# Patient Record
Sex: Female | Born: 1955
Health system: Southern US, Community
[De-identification: ages and names within clinical notes are randomized; demographics above are authoritative.]

## PROBLEM LIST (undated history)

## (undated) DIAGNOSIS — H409 Unspecified glaucoma: Secondary | ICD-10-CM

## (undated) DIAGNOSIS — M069 Rheumatoid arthritis, unspecified: Secondary | ICD-10-CM

## (undated) DIAGNOSIS — M3500 Sicca syndrome, unspecified: Secondary | ICD-10-CM

## (undated) DIAGNOSIS — M549 Dorsalgia, unspecified: Secondary | ICD-10-CM

## (undated) DIAGNOSIS — J040 Acute laryngitis: Secondary | ICD-10-CM

## (undated) DIAGNOSIS — E7800 Pure hypercholesterolemia, unspecified: Secondary | ICD-10-CM

## (undated) DIAGNOSIS — E785 Hyperlipidemia, unspecified: Secondary | ICD-10-CM

## (undated) DIAGNOSIS — M797 Fibromyalgia: Secondary | ICD-10-CM

## (undated) DIAGNOSIS — L409 Psoriasis, unspecified: Secondary | ICD-10-CM

## (undated) DIAGNOSIS — E039 Hypothyroidism, unspecified: Secondary | ICD-10-CM

## (undated) DIAGNOSIS — M8580 Other specified disorders of bone density and structure, unspecified site: Secondary | ICD-10-CM

## (undated) DIAGNOSIS — F1120 Opioid dependence, uncomplicated: Secondary | ICD-10-CM

## (undated) DIAGNOSIS — I447 Left bundle-branch block, unspecified: Secondary | ICD-10-CM

## (undated) DIAGNOSIS — C649 Malignant neoplasm of unspecified kidney, except renal pelvis: Secondary | ICD-10-CM

## (undated) DIAGNOSIS — I771 Stricture of artery: Secondary | ICD-10-CM

## (undated) DIAGNOSIS — K219 Gastro-esophageal reflux disease without esophagitis: Secondary | ICD-10-CM

## (undated) DIAGNOSIS — G8929 Other chronic pain: Secondary | ICD-10-CM

## (undated) DIAGNOSIS — M1990 Unspecified osteoarthritis, unspecified site: Secondary | ICD-10-CM

## (undated) DIAGNOSIS — I10 Essential (primary) hypertension: Secondary | ICD-10-CM

## (undated) DIAGNOSIS — E876 Hypokalemia: Secondary | ICD-10-CM

## (undated) DIAGNOSIS — J449 Chronic obstructive pulmonary disease, unspecified: Secondary | ICD-10-CM

## (undated) DIAGNOSIS — L4050 Arthropathic psoriasis, unspecified: Secondary | ICD-10-CM

## (undated) HISTORY — PX: LAPAROSCOPIC OVARIAN CYSTECTOMY: SUR786

## (undated) HISTORY — DX: Left bundle-branch block, unspecified: I44.7

## (undated) HISTORY — DX: Hyperlipidemia, unspecified: E78.5

## (undated) HISTORY — DX: Sjogren syndrome, unspecified: M35.00

## (undated) HISTORY — DX: Chronic obstructive pulmonary disease, unspecified: J44.9

## (undated) HISTORY — DX: Arthropathic psoriasis, unspecified: L40.50

## (undated) HISTORY — PX: ABDOMINAL HYSTERECTOMY: SUR658

## (undated) HISTORY — DX: Unspecified glaucoma: H40.9

## (undated) HISTORY — DX: Unspecified osteoarthritis, unspecified site: M19.90

## (undated) HISTORY — DX: Rheumatoid arthritis, unspecified: M06.9

## (undated) HISTORY — DX: Hypokalemia: E87.6

## (undated) HISTORY — DX: Malignant neoplasm of unspecified kidney, except renal pelvis: C64.9

## (undated) HISTORY — PX: APPENDECTOMY: SHX54

## (undated) HISTORY — DX: Stricture of artery: I77.1

## (undated) HISTORY — PX: TONSILLECTOMY: SUR1361

---

## 1998-06-05 ENCOUNTER — Other Ambulatory Visit: Admission: RE | Admit: 1998-06-05 | Discharge: 1998-06-05 | Payer: Self-pay | Admitting: Internal Medicine

## 1999-05-28 ENCOUNTER — Encounter: Admission: RE | Admit: 1999-05-28 | Discharge: 1999-05-28 | Payer: Self-pay | Admitting: Internal Medicine

## 1999-05-28 ENCOUNTER — Encounter: Payer: Self-pay | Admitting: Internal Medicine

## 2000-04-19 ENCOUNTER — Encounter: Payer: Self-pay | Admitting: Internal Medicine

## 2000-04-19 ENCOUNTER — Encounter: Admission: RE | Admit: 2000-04-19 | Discharge: 2000-04-19 | Payer: Self-pay | Admitting: Internal Medicine

## 2000-04-27 ENCOUNTER — Encounter: Admission: RE | Admit: 2000-04-27 | Discharge: 2000-06-08 | Payer: Self-pay | Admitting: Neurosurgery

## 2002-06-18 ENCOUNTER — Ambulatory Visit (HOSPITAL_COMMUNITY): Admission: RE | Admit: 2002-06-18 | Discharge: 2002-06-18 | Payer: Self-pay | Admitting: Cardiology

## 2002-06-18 HISTORY — PX: CARDIAC CATHETERIZATION: SHX172

## 2002-07-01 ENCOUNTER — Ambulatory Visit (HOSPITAL_COMMUNITY): Admission: RE | Admit: 2002-07-01 | Discharge: 2002-07-01 | Payer: Self-pay | Admitting: Internal Medicine

## 2003-02-28 ENCOUNTER — Ambulatory Visit (HOSPITAL_COMMUNITY): Admission: RE | Admit: 2003-02-28 | Discharge: 2003-02-28 | Payer: Self-pay | Admitting: Internal Medicine

## 2003-04-16 ENCOUNTER — Ambulatory Visit (HOSPITAL_COMMUNITY): Admission: RE | Admit: 2003-04-16 | Discharge: 2003-04-16 | Payer: Self-pay | Admitting: *Deleted

## 2005-05-31 ENCOUNTER — Encounter: Admission: RE | Admit: 2005-05-31 | Discharge: 2005-05-31 | Payer: Self-pay | Admitting: Orthopedic Surgery

## 2005-06-14 ENCOUNTER — Encounter: Admission: RE | Admit: 2005-06-14 | Discharge: 2005-06-14 | Payer: Self-pay | Admitting: Orthopedic Surgery

## 2005-11-10 ENCOUNTER — Encounter: Admission: RE | Admit: 2005-11-10 | Discharge: 2005-11-10 | Payer: Self-pay | Admitting: Internal Medicine

## 2006-11-07 ENCOUNTER — Encounter: Admission: RE | Admit: 2006-11-07 | Discharge: 2006-11-07 | Payer: Self-pay | Admitting: Internal Medicine

## 2007-06-27 ENCOUNTER — Encounter: Admission: RE | Admit: 2007-06-27 | Discharge: 2007-08-16 | Payer: Self-pay | Admitting: Psychiatry

## 2008-02-19 ENCOUNTER — Encounter: Admission: RE | Admit: 2008-02-19 | Discharge: 2008-02-19 | Payer: Self-pay | Admitting: Endocrinology

## 2008-08-13 ENCOUNTER — Inpatient Hospital Stay (HOSPITAL_COMMUNITY): Admission: EM | Admit: 2008-08-13 | Discharge: 2008-08-14 | Payer: Self-pay | Admitting: Emergency Medicine

## 2009-02-18 ENCOUNTER — Encounter: Admission: RE | Admit: 2009-02-18 | Discharge: 2009-02-18 | Payer: Self-pay | Admitting: Endocrinology

## 2009-07-17 HISTORY — PX: NM MYOCAR PERF WALL MOTION: HXRAD629

## 2010-03-19 ENCOUNTER — Encounter
Admission: RE | Admit: 2010-03-19 | Discharge: 2010-03-19 | Payer: Self-pay | Source: Home / Self Care | Attending: Internal Medicine | Admitting: Internal Medicine

## 2010-07-20 LAB — CBC
HCT: 40.4 % (ref 36.0–46.0)
HCT: 43.4 % (ref 36.0–46.0)
Hemoglobin: 14 g/dL (ref 12.0–15.0)
MCV: 93.2 fL (ref 78.0–100.0)
MCV: 93.7 fL (ref 78.0–100.0)
Platelets: 212 10*3/uL (ref 150–400)
RBC: 4.66 MIL/uL (ref 3.87–5.11)
RDW: 12.3 % (ref 11.5–15.5)
RDW: 12.4 % (ref 11.5–15.5)
WBC: 10.1 10*3/uL (ref 4.0–10.5)
WBC: 8.5 10*3/uL (ref 4.0–10.5)

## 2010-07-20 LAB — DIFFERENTIAL
Lymphs Abs: 3.9 10*3/uL (ref 0.7–4.0)
Neutrophils Relative %: 52 % (ref 43–77)

## 2010-07-20 LAB — BASIC METABOLIC PANEL: GFR calc Af Amer: >60 mL/min (ref >60)

## 2010-07-20 LAB — CARDIAC PANEL(CRET KIN+CKTOT+MB+TROPI)
CK, MB: 2.5 ng/mL (ref 0.3–4.0)
Relative Index: 1.8 (ref 0.0–2.5)
Total CK: 142 U/L (ref 7–177)

## 2010-07-20 LAB — HEMOGLOBIN A1C: Hgb A1c MFr Bld: 5.9 % (ref 4.6–6.1)

## 2010-07-20 LAB — POCT I-STAT, CHEM 8
BUN: 10 mg/dL (ref 6–23)
Calcium, Ion: 1.11 mmol/L — ABNORMAL LOW (ref 1.12–1.32)
Creatinine, Ser: 0.9 mg/dL (ref 0.4–1.2)
HCT: 46 % (ref 36.0–46.0)
Hemoglobin: 15.6 g/dL — ABNORMAL HIGH (ref 12.0–15.0)
Potassium: 3.8 mEq/L (ref 3.5–5.1)
Sodium: 137 mEq/L (ref 135–145)

## 2010-07-20 LAB — D-DIMER, QUANTITATIVE: D-Dimer, Quant: 0.27 ug/mL-FEU (ref 0.00–0.48)

## 2010-07-20 LAB — PROTIME-INR: Prothrombin Time: 14.6 seconds (ref 11.6–15.2)

## 2010-07-20 LAB — CK TOTAL AND CKMB (NOT AT ARMC)
CK, MB: 2.6 ng/mL (ref 0.3–4.0)
Relative Index: 1.7 (ref 0.0–2.5)
Total CK: 156 U/L (ref 7–177)

## 2010-07-20 LAB — POCT CARDIAC MARKERS: CKMB, poc: 1.4 ng/mL (ref 1.0–8.0)

## 2010-07-20 LAB — LIPID PANEL
LDL Cholesterol: 86 mg/dL (ref 0–99)
Triglycerides: 125 mg/dL (ref <150)
VLDL: 25 mg/dL (ref 0–40)

## 2010-07-20 LAB — CREATININE, SERUM
Creatinine, Ser: 0.9 mg/dL (ref 0.4–1.2)
GFR calc non Af Amer: >60 mL/min (ref >60)

## 2010-07-20 LAB — APTT: aPTT: 39 seconds — ABNORMAL HIGH (ref 24–37)

## 2010-08-05 HISTORY — PX: US ECHOCARDIOGRAPHY: HXRAD669

## 2010-08-24 NOTE — H&P (Signed)
[name], NOLTON NO.:  [id number]   MEDICAL RECORD NO.:  [id number]          PATIENT TYPE:  INP   LOCATION:  2033                         FACILITY:  [facility]   PHYSICIAN:  Sheliah Mends, MD      DATE OF BIRTH:  February 03, 1956   DATE OF ADMISSION:  08/13/2008  DATE OF DISCHARGE:                              HISTORY & PHYSICAL   PRIMARY CARE PHYSICIAN:  Soyla Murphy. Renne Crigler, M.D.   PRIMARY CARDIOLOGIST:  Antionette Char, M.D.   CHIEF COMPLAINT:  Chest pain.   HISTORY OF THE PRESENT ILLNESS:  The patient is a 55 year old white  female with history of patent coronary arteries in 2004 after a positive  stress test and has done well since that time who presented to Dr.  Hulan Fess office today with chest pain.  The pain began Saturday.  She had mowed her lawn with a self-propelled machine.  She takes her  time, stops and has beverages, and keep walking.  She felt fine  initially and then  became nauseated, had severe no energy that just  occurred suddenly.  She actually fell to her knees and had to lie down  on the kitchen floor; she thought she was going to pass out prior to  that.  At that point she felt like she had a vice around her chest with  pain radiating from her chest to her back.  She was very weak.  She  would have called 9-1-1 at that point, but she was not near the phone;  and, then she started feeling better.  She rested, she ate and she felt  better, but every time she would do much it would return, but not nearly  as severe.  She actually did not feel well until Monday and she started  feeling as mostly it was back and neck pain described as from the base  of her skull down into her thoracic spine.  No shortness of breath.  Because she continued not to feel well she went to Dr. Carolee Rota office.  There he called EMS because she had a new left bundle branch block on  her EKG and EMS gave her aspirin as well as nitroglycerin, she improved  completely and  she had no complaints afterwards.  Currently, she does  complain of some aching in her chest and more severe neck pain.  The  patient is a little more complicated because she does have arthritis  that is fairly severe and she is not sure if the neck pain is more  related to the arthritis versus the pain she had over the weekend.   PAST MEDICAL AND SURGICAL HISTORY:  1. Hypertension.  2. Psoriatic arthritis as well as psoriasis.  3. Hypothyroidism.  4. Glaucoma.  5. Fibromyalgia.  6. Dyslipidemia.  7. Peripheral vascular disease with carotid stenosis, mild per Dr.      Carolee Rota note.  8. Status post hysterectomy.  9. The patient has a history of two C-sections in the past;  10.Appendectomy; and,  11.Tonsillectomy.   ALLERGIES:  LEVAQUIN AND CODEINE.  WHEN SHE  TAKES BABY ASPIRIN ON A  DAILY BASIS IT CAUSES NAUSEA AND GI UPSET.  FIORICET CAUSES NAUSEA.  DARVOCET CAUSES NAUSEA.  HIGH-DOSE ZOLOFT CAUSES HOMICIDAL THOUGHTS.   FAMILY HISTORY:  Mother died at age 33, but she began having MIs in her  57s and she was diabetic.  Father died at 64 with a stroke and he also  was a  diabetic, had TB and, she believes, coronary disease.  She has of  8 brothers; two have coronary disease as well as cancer.  One sister  died of the seizures and one sister is alive and well.   SOCIAL HISTORY:  The patient is divorced with two children and nine  grandchildren.  She smokes one half to three-quarters of a pack of  cigarettes per day or every 2 days; sometimes she may even go months  without smoking.  She actually started smoking at  age 32 and more  consecutively at age; but, smoked steadily, three-quarters of a pack for  26 years.  She is active and meals on wheels her lawn.  She is does not  work due to disability due to the arthritis.  No alcohol use.   REVIEW OF SYSTEMS:  GENERAL:  Not felt well now for a couple weeks; kind  of headachy, but no fever though.  INTEGUMENT:  Skin is without  rashes,  except for her psoriasis.  GASTROINTESTINAL:  Nausea and occasional  loose stool, but no blood in her stools.  GENITOURINARY:  No hematuria  or dysuria.  MUSCULOSKELETAL: She did have some swelling in her right  thigh briefly, but  this resolved and there is no sign of that now, and  no pain.  She does have constant pain from the arthritis in her legs,  arms and neck.  NEUROLOGICAL:  No syncope, except for near syncope with  Saturday's episode.  PULMONARY:  Positive tobacco use.  ENDOCRINE:  Positive thyroid disease.  CARDIOVASCULAR:  Occasional fluttering; and,  see the HPI.   PHYSICAL EXAMINATION:  VITAL SIGNS:  Blood pressure 118/72, pulse 65,  respirations 20, temperature 97, and oxygen saturation on 4 liters is  100%.  GENERAL APPEARANCE:  In general the patient is alert and oriented x3.  SKIN:  The skin is warm and dry.  Brisk capillary refill.  PSYCHIATRIC:  Pleasant affect.  HEENT:  Normocephalic head.  Sclerae clear.  NECK:  The neck is supple.  No JVD and no bruits are detected.  HEART:  Cardiac - S1 and S2, regular rate and rhythm without obvious  murmur, gallop, rub or click.  LUNGS:  The lungs are clear without rales, rhonchi or wheezes.  ABDOMEN:  The abdomen is soft and nontender with positive bowel sounds.  EXTREMITIES:  The extremities are without edema currently.  NEUROLOGIC EXAMINATION:  Neuro - alert and oriented x3.  Moves all  extremities.  Follows commands.   LABORATORY DATA:  CBC;  hemoglobin 15.1, hematocrit 43.4, platelet  312,000, and WBCs 10.  Sodium 137, potassium 3.8, BUN 10, creatinine  0.9, and  glucose 94.  CK/MB was 156 with 2.6 MB.  Troponin I 0.02.  Two-  view of the chest; no acute findings, but mild thickening of the minor  fissure.  EKG;  sinus rhythm with a left bundle branch block.   IMPRESSION:  1. Chest pain, weakness and nausea.  2. New left bundle branch block.  3. Positive tobacco use.  4. Dyslipidemia.  Of note, her family  history is positive  for coronary      disease, premature, with mother in her 25s.  5. Psoriatic arthritis, on multiple pain medications.   PLAN:  1. Admit to telemetry bed.  2. Serial CK/MB and troponin I.  3. Lovenox.  4. Nothing per os after midnight for possible cath versus stress and      likely scan Myoview secondary to a new left bundle branch block.  5. Continue pain meds.  6. Dr. Garen Lah is aware of the patient's admission.  7. We will check lipids in the morning as well.  8. Also, because of the right thigh swelling, we will check a D-dimer      and she will be on Lovenox.  9. Also tobacco cessation.      Darcella Gasman. Annie Paras, N.P.      Sheliah Mends, MD  Electronically Signed    LRI/MEDQ  D:  08/13/2008  T:  08/14/2008  Job:  [id number]   cc:   Soyla Murphy. Renne Crigler, M.D.  Antionette Char, MD

## 2010-08-27 NOTE — Op Note (Signed)
NAME:  [name], [name]                          ACCOUNT NO.:  [id number]   MEDICAL RECORD NO.:  [id number]                   PATIENT TYPE:  AMB   LOCATION:  ENDO                                 [facility]AC[facility]  [facility]   PHYSICIAN:  Georgiana Spinner, M.D.                 DATE OF BIRTH:  [birth year]   DATE OF PROCEDURE:  DATE OF DISCHARGE:                                 OPERATIVE REPORT   PROCEDURE:  Colonoscopy.   INDICATIONS:  Colon cancer screening and hemoccult positivity.   ANESTHESIA:  Demerol 30 mg, Versed 3.   DESCRIPTION OF PROCEDURE:  With the patient mildly sedated and in the left  lateral decubitus position, the Olympus videoscopic colonoscope was inserted  in the rectum and passed under direct vision to the cecum, identified by  ileocecal valve and appendiceal orifice, both of which were photographed.  From this point,  the colonoscope was slowly withdrawn, taking  circumferential views of the entire colonic mucosa stopping only in the  rectum which appeared normal on direct and showed hemorrhoids on retroflexed  view.  The endoscope was straightened and withdrawn.  The patient's vital  signs and pulse oximetry remained stable.  The patient tolerated the  procedure well without apparent complications.   FINDINGS:  Internal hemorrhoids; otherwise unremarkable exam.   PLAN:  Have the patient follow up with me as needed.                                               Georgiana Spinner, M.D.    GMO/MEDQ  D:  04/16/2003  T:  04/16/2003  Job:  [id number]

## 2010-08-27 NOTE — Discharge Summary (Signed)
[name], [name] NO.:  [id number]   MEDICAL RECORD NO.:  [id number]          PATIENT TYPE:  INP   LOCATION:  2033                         FACILITY:  [facility]   PHYSICIAN:  Sheliah Mends, MD      DATE OF BIRTH:  Jan 13, 1956   DATE OF ADMISSION:  08/13/2008  DATE OF DISCHARGE:  08/14/2008                               DISCHARGE SUMMARY   The patient signed out AMA on Aug 14, 2008.   DISCHARGE DIAGNOSES:  1. Chest pain, weakness, and nausea.  2. Left bundle-branch block, which was new.  3. Positive tobacco use.  4. Family history of coronary disease with mother age 85.  5. Psoriatic arthritis, on multiple pain medications.  6. The patient was agitated and has written on her AMA form, could not      get meds like I was used to taking care of my pain, and she could      not wait to talk to physician in the morning.  She left the      hospital, so no new medications were added.   HISTORY OF PRESENT ILLNESS:  Pleasant 55 year old female with history of  pain course in 2004 with cardiac cath, presented to Dr. Renne Crigler on Aug 13, 2008 with chest pain.  The pain began Saturday.  She had mowed the lawn  with a [employer], felt fine and became nauseated, no energy  suddenly, near-syncope, and actually fell to her knees in her kitchen.  She felt as though ice was around her chest, very weak.  She then began  to feel better, ate, felt bad until Monday, then back and neck pain.  No  shortness of breath, but she did not feel well, so she saw Dr. Renne Crigler.  She was given nitroglycerin with improvement.  EMS was called.  She was  brought to [facility] and we saw her here.  She had been given orders to  continue home medicines, which she unfortunately was not brought to her  on the floor.  We instructed the floor to please get her home meds ASAP  and then she would more likely stay if she was able to take her meds as  previously prescribed.   She did stay the night, but by  the next morning again was not receiving  medications as quickly, she felt she needed them and signed out AMA.   PAST MEDICAL HISTORY:  Hypertension, psoriatic arthritis, hypothyroid.  No diabetes, fibromyalgia, psoriasis, dyslipidemia, peripheral vascular  disease.   ALLERGIES:  LEVAQUIN, CODEINE, and even BABY ASPIRIN causes nausea and  GI upset.  FIORICET cause nausea.  DARVOCET cause nausea.  Increased  dose of ZOLOFT caused homicidal thoughts.   FAMILY HISTORY:  Mother died at 60 after Mis.  Father died at 26 with  coronary disease.  Brother has diabetes.  Two brothers with heart  disease, 1 sister who is alive and well.   SOCIAL HISTORY:  Divorced, 2  children, 9 grandchildren.  Positive  tobacco for 1/2 to 3/4 pack per day x40 years.  She was active,  was  mowing the grass, and no alcohol use.   PHYSICAL EXAMINATION:  VITAL SIGNS:  On admission, her blood pressure  was 118/72, pulse 65, respirations 20, temperature 97, oxygen saturation  on 4 L was 100%.   LABORATORY VALUES:  Hemoglobin 15.1, hematocrit 43.4, WBC 10.1,  platelets 212.  Neutrophil 52, lymphs 39, monos 7, eos 2, baso 1.  Protime 14.6, INR of 1.1, PTT 39, and D-dimer was 0.27.  Sodium 137,  potassium 3.8, chloride 101, glucose 94, BUN 10, creatinine 0.90.  Remained stable.  Glycohemoglobin was 5.9.  Cardiac enzymes were  negative with CK 156, 142; MB 2.6, 2.5; and troponin 0.02.   Total cholesterol 139, triglycerides 125, HDL 28, and LDL 86.  TSH  0.838.  EKGs, sinus rhythm with left bundle-branch block.  Chest x-ray,  two views; no acute findings, mild thickness of minor fissure.      Darcella Gasman. Annie Paras, N.P.      Sheliah Mends, MD  Electronically Signed    LRI/MEDQ  D:  09/04/2008  T:  09/05/2008  Job:  [id number]   cc:   Antionette Char, MD  Sheliah Mends, MD  Soyla Murphy. Renne Crigler, M.D.

## 2010-08-27 NOTE — Op Note (Signed)
NAME:  [name], [name]                          ACCOUNT NO.:  [id number]   MEDICAL RECORD NO.:  [id number]                   PATIENT TYPE:  AMB   LOCATION:  ENDO                                 FACILITY:  [facility]   PHYSICIAN:  Georgiana Spinner, M.D.                 DATE OF BIRTH:  06/07/1955   DATE OF PROCEDURE:  DATE OF DISCHARGE:                                 OPERATIVE REPORT   PROCEDURE:  Upper endoscopy.   INDICATIONS FOR PROCEDURE:  Abdominal pain.   ANESTHESIA:  Demerol 100, Versed 10 mg.   DESCRIPTION OF PROCEDURE:  With the patient mildly sedated in the left  lateral decubitus position, the Olympus videoscopic endoscope was inserted  in the mouth and passed under direct vision through the esophagus which  appeared normal into the stomach. The fundus, body, antrum, duodenal bulb  and second portion of the duodenum appeared normal. From this point, the  endoscope was slowly withdrawn taking circumferential views of the entire  duodenal mucosa until the endoscope was then pulled back in the stomach,  placed in retroflexion to view the stomach from below. The endoscope was  then straightened and withdrawn taking circumferential views of the  remaining gastric and esophageal mucosa. The patient's vital signs and pulse  oximeter remained stable. The patient tolerated the procedure well without  apparent complications.   FINDINGS:  Unremarkable examination.   PLAN:  Proceed to colonoscopy.                                               Georgiana Spinner, M.D.    GMO/MEDQ  D:  04/16/2003  T:  04/16/2003  Job:  [id number]

## 2010-08-27 NOTE — Cardiovascular Report (Signed)
NAME:  [name], [name]                          ACCOUNT NO.:  [id number]   MEDICAL RECORD NO.:  [id number]                   PATIENT TYPE:  OIB   LOCATION:  2853                                 FACILITY:  [facility]   PHYSICIAN:  Aram Candela. Tysinger, M.D.              DATE OF BIRTH:  1955-06-29   DATE OF PROCEDURE:  06/18/2002  DATE OF DISCHARGE:                              CARDIAC CATHETERIZATION   REFERRING PHYSICIAN:  Soyla Murphy. Renne Crigler, M.D.   PROCEDURES:  1. Left heart catheterization.  2. Coronary cineangiography.  3. Left ventricular cineangiography.  4. Abdominal aortogram.  5. Perclose of the right femoral artery.   INDICATION FOR PROCEDURES:  This 55 year old female with a history of  psoriatic rheumatoid arthritis, hypertension and a strong family history of  coronary artery disease with heart attacks at an early age had the onset of  chest pain approximately a year ago with acceleration recently.  She had a  stress test which was early positive showing moderate high-grade for  ischemic heart disease.  She also has an abnormal resting electrocardiogram  with evidence for possible old anteroseptal infarct.   DESCRIPTION OF PROCEDURE:  After signing an informed consent, the patient  was premedicated with 50 mg of Benadryl intravenously and brought to the  cardiac catheterization lab.  Her right groin was prepped and draped in a  sterile fashion and anesthetized locally with 1% lidocaine.  A 6 French  introducer sheath was inserted percutaneously into the right femoral artery.  A 6 French #4 Judkins coronary catheters were used to make injections into  the native coronary arteries.  A 6 French pigtail catheter was used to  measure pressures in the left ventricle and aorta and to make mid stream  injections into the left ventricle and abdominal aorta.  The patient  tolerated the procedure well and no complications were noted.  At the end of  the procedure, the catheter and sheath  were removed from the right femoral  artery and hemostasis was easily obtained with a Perclose closure system.   MEDICATIONS GIVEN:  None.   HEMODYNAMIC DATA:  Left ventricular pressure 160/15-20, aortic pressure  160/82 with a mean of 114.  Left ventricular ejection fraction 60.   CINE FINDINGS:   CORONARY CINE ANGIOGRAPHY:  1. Left coronary artery:  The ostium and left main appear normal.  2. Left anterior descending appears normal.  3. Circumflex coronary artery appears normal.  4. Right coronary artery appears normal.   LEFT VENTRICULAR CINEANGIOGRAM:  The left ventricular chamber size and  contractility appears normal with an ejection fraction estimated at 60%.  There were no abnormally moving segments.  The mitral and aortic valves  appear normal.   ABDOMINAL AORTOGRAM:  The abdominal aorta appears normal without evidence  for atherosclerotic plaque and normal antegrade flow.  The renal arteries  are normal.   FINAL DIAGNOSES:  1. Normal cardiac  study.  2. Normal coronary arteries.  3. Normal left ventricular function.  4. Normal mitral and aortic valves.  5. Normal abdominal aorta and renal arteries.  6. Successful Perclose of the right femoral artery.   DISPOSITION:  Will monitor on the short-stay unit prior to discharge later  today and will arrange for followup in the office with myself and with Dr.  Renne Crigler.                                                    John R. Aleen Campi, M.D.    JRT/MEDQ  D:  06/18/2002  T:  06/18/2002  Job:  [id number]   cc:   Soyla Murphy. Renne Crigler, M.D.  [address]  [place]  [place] [address]  Fax: [phone]   Cardiac Catheterization Lab

## 2011-02-28 ENCOUNTER — Emergency Department (HOSPITAL_COMMUNITY): Payer: Medicare Other

## 2011-02-28 ENCOUNTER — Emergency Department (HOSPITAL_COMMUNITY)
Admission: EM | Admit: 2011-02-28 | Discharge: 2011-02-28 | Disposition: A | Discharge status: Home / Self Care | Payer: Medicare Other | Attending: Emergency Medicine | Admitting: Emergency Medicine

## 2011-02-28 ENCOUNTER — Encounter: Payer: Self-pay | Admitting: Emergency Medicine

## 2011-02-28 DIAGNOSIS — E039 Hypothyroidism, unspecified: Secondary | ICD-10-CM | POA: Insufficient documentation

## 2011-02-28 DIAGNOSIS — M549 Dorsalgia, unspecified: Secondary | ICD-10-CM | POA: Insufficient documentation

## 2011-02-28 DIAGNOSIS — I10 Essential (primary) hypertension: Secondary | ICD-10-CM | POA: Insufficient documentation

## 2011-02-28 DIAGNOSIS — G43909 Migraine, unspecified, not intractable, without status migrainosus: Secondary | ICD-10-CM | POA: Insufficient documentation

## 2011-02-28 DIAGNOSIS — R29898 Other symptoms and signs involving the musculoskeletal system: Secondary | ICD-10-CM | POA: Insufficient documentation

## 2011-02-28 DIAGNOSIS — Z79899 Other long term (current) drug therapy: Secondary | ICD-10-CM | POA: Insufficient documentation

## 2011-02-28 DIAGNOSIS — R209 Unspecified disturbances of skin sensation: Secondary | ICD-10-CM | POA: Insufficient documentation

## 2011-02-28 DIAGNOSIS — R110 Nausea: Secondary | ICD-10-CM | POA: Insufficient documentation

## 2011-02-28 DIAGNOSIS — E7800 Pure hypercholesterolemia, unspecified: Secondary | ICD-10-CM | POA: Insufficient documentation

## 2011-02-28 DIAGNOSIS — K219 Gastro-esophageal reflux disease without esophagitis: Secondary | ICD-10-CM | POA: Insufficient documentation

## 2011-02-28 DIAGNOSIS — G8929 Other chronic pain: Secondary | ICD-10-CM | POA: Insufficient documentation

## 2011-02-28 HISTORY — DX: Psoriasis, unspecified: L40.9

## 2011-02-28 HISTORY — DX: Pure hypercholesterolemia, unspecified: E78.00

## 2011-02-28 HISTORY — DX: Opioid dependence, uncomplicated: F11.20

## 2011-02-28 HISTORY — DX: Gastro-esophageal reflux disease without esophagitis: K21.9

## 2011-02-28 HISTORY — DX: Other chronic pain: G89.29

## 2011-02-28 HISTORY — DX: Essential (primary) hypertension: I10

## 2011-02-28 HISTORY — DX: Fibromyalgia: M79.7

## 2011-02-28 HISTORY — DX: Dorsalgia, unspecified: M54.9

## 2011-02-28 HISTORY — DX: Acute laryngitis: J04.0

## 2011-02-28 HISTORY — DX: Hypothyroidism, unspecified: E03.9

## 2011-02-28 HISTORY — DX: Other specified disorders of bone density and structure, unspecified site: M85.80

## 2011-02-28 LAB — CBC
MCH: 31.9 pg (ref 26.0–34.0)
MCHC: 33.9 g/dL (ref 30.0–36.0)
Platelets: 269 10*3/uL (ref 150–400)
RBC: 4.48 MIL/uL (ref 3.87–5.11)
RDW: 12.4 % (ref 11.5–15.5)

## 2011-02-28 LAB — BASIC METABOLIC PANEL
CO2: 28 mEq/L (ref 19–32)
Calcium: 9.9 mg/dL (ref 8.4–10.5)
GFR calc Af Amer: >90 mL/min (ref >90)
GFR calc non Af Amer: >90 mL/min (ref >90)
Sodium: 138 mEq/L (ref 135–145)

## 2011-02-28 MED ORDER — DIPHENHYDRAMINE HCL 50 MG/ML IJ SOLN
25.0000 mg | Freq: Once | INTRAMUSCULAR | Status: AC
  Administered 2011-02-28: 25 mg via INTRAVENOUS
  Filled 2011-02-28: qty 1

## 2011-02-28 MED ORDER — METOCLOPRAMIDE HCL 5 MG/ML IJ SOLN
10.0000 mg | Freq: Once | INTRAMUSCULAR | Status: AC
  Administered 2011-02-28: 10 mg via INTRAVENOUS
  Filled 2011-02-28: qty 2

## 2011-02-28 MED ORDER — SODIUM CHLORIDE 0.9 % IV BOLUS (SEPSIS)
1000.0000 mL | Freq: Once | INTRAVENOUS | Status: AC
  Administered 2011-02-28: 1000 mL via INTRAVENOUS

## 2011-02-28 MED ORDER — DEXAMETHASONE SODIUM PHOSPHATE 10 MG/ML IJ SOLN
10.0000 mg | Freq: Once | INTRAMUSCULAR | Status: AC
  Administered 2011-02-28: 10 mg via INTRAVENOUS
  Filled 2011-02-28: qty 1

## 2011-02-28 NOTE — ED Provider Notes (Signed)
History     CSN: [id number] Arrival date & time: 02/28/2011  5:18 PM   First MD Initiated Contact with Patient 02/28/11 1838      Chief Complaint  Patient presents with  . Migraine    (Consider location/radiation/quality/duration/timing/severity/associated sxs/prior treatment) Patient is a 55 y.o. female presenting with headaches and neurologic complaint. The history is provided by the patient.  Headache  This is a new problem. The current episode started more than 2 days ago. The problem occurs constantly. The problem has not changed since onset.The headache is associated with an unknown factor. The pain is located in the bilateral region. The pain is at a severity of 10/10. The pain is severe. The pain does not radiate. Associated symptoms include nausea. Pertinent negatives include no fever, no shortness of breath and no vomiting. She has tried oral narcotic analgesics for the symptoms. The treatment provided no relief.  Neurologic Problem The primary symptoms include headaches, loss of sensation (right hand (though has resolved except right thumb) and nausea. Primary symptoms do not include fever or vomiting. The symptoms began 3 to 5 days ago. The symptoms are improving. The neurological symptoms are focal.  The headache began more than 2 days ago. The headache developed gradually. Headache is a new problem. The headache is present rarely. Location/region(s) of the headache: bilateral. The headache is not associated with neck stiffness or weakness.  Loss of sensation began 6 - 12 hours ago. The loss of sensation is improving. The deficit is described as loss of sensation to touch and pain. Affected locations include: right thumb.  Nausea began 3 to 5 days ago.  Additional symptoms do not include neck stiffness or weakness.    Past Medical History  Diagnosis Date  . Psoriasis   . Chronic back pain   . Hypertension   . Hypercholesteremia   . Osteopenia   . Hypothyroid   .  Fibromyalgia   . Addiction, opium   . GERD (gastroesophageal reflux disease)   . Laryngitis     Past Surgical History  Procedure Date  . Cesarean section   . Appendectomy   . Laparoscopic ovarian cystectomy   . Tonsillectomy     No family history on file.  History  Substance Use Topics  . Smoking status: Not on file  . Smokeless tobacco: Not on file  . Alcohol Use:     OB History    Grav Para Term Preterm Abortions TAB SAB Ect Mult Living                  Review of Systems  Constitutional: Negative for fever.  HENT: Negative for neck stiffness.   Respiratory: Negative for cough and shortness of breath.   Cardiovascular: Negative for chest pain.  Gastrointestinal: Positive for nausea. Negative for vomiting, abdominal pain and diarrhea.  Neurological: Positive for headaches. Negative for weakness.  All other systems reviewed and are negative.    Allergies  Arava; Aspirin; Codeine; Darvocet; Fioricet; Levaquin; Sulfa antibiotics; and Zoloft  Home Medications   Current Outpatient Rx  Name Route Sig Dispense Refill  . ATORVASTATIN CALCIUM 40 MG PO TABS Oral Take 40 mg by mouth daily.      . CELECOXIB 200 MG PO CAPS Oral Take 200 mg by mouth daily.      Marland Kitchen ETANERCEPT 50 MG/ML [place] SOLN Subcutaneous Inject 50 mg into the skin 2 (two) times a week. Tuesday and Friday.    Marland Kitchen GABAPENTIN 600 MG PO TABS Oral Take  600 mg by mouth 3 (three) times daily.      Marland Kitchen LEVOTHYROXINE SODIUM 112 MCG PO TABS Oral Take 112 mcg by mouth daily.      Marland Kitchen METHADONE HCL 10 MG PO TABS Oral Take 10 mg by mouth 3 (three) times daily.      Carma Leaven M PLUS PO TABS Oral Take 1 tablet by mouth daily.      . OMEGA-3-ACID ETHYL ESTERS 1 G PO CAPS Oral Take 3 g by mouth daily.      . OXYCODONE-ACETAMINOPHEN 10-325 MG PO TABS Oral Take 1 tablet by mouth 3 (three) times daily as needed. For pain     . TIZANIDINE HCL 4 MG PO TABS Oral Take 4-8 mg by mouth 4 (four) times daily.      Marland Kitchen  VALSARTAN-HYDROCHLOROTHIAZIDE 160-25 MG PO TABS Oral Take 1 tablet by mouth daily.        BP 127/77  Pulse 67  Temp(Src) 98 F (36.7 C) (Oral)  Resp 16  SpO2 100%  Physical Exam  Nursing note and vitals reviewed. Constitutional: She is oriented to person, place, and time. She appears well-developed and well-nourished. No distress.  HENT:  Head: Normocephalic and atraumatic.  Mouth/Throat: Oropharynx is clear and moist.  Eyes: EOM are normal. Pupils are equal, round, and reactive to light.  Neck: Normal range of motion.  Cardiovascular: Normal rate, regular rhythm and normal heart sounds.   Pulmonary/Chest: Effort normal and breath sounds normal. No respiratory distress.  Abdominal: Soft. She exhibits no distension. There is no tenderness.  Musculoskeletal: Normal range of motion.  Neurological: She is alert and oriented to person, place, and time. A sensory deficit is present. No cranial nerve deficit. She exhibits abnormal muscle tone (slight decrease in stregnth on right side as compared to left). Coordination normal. GCS eye subscore is 4. GCS verbal subscore is 5. GCS motor subscore is 6.       No sensation on top of thumb, but present on bottom of thumb to both light touch and sharp  Skin: Skin is warm and dry.    ED Course  Procedures (including critical care time)   Labs Reviewed  CBC  BASIC METABOLIC PANEL   Ct Head Wo Contrast  02/28/2011  *RADIOLOGY REPORT*  Clinical Data: Migraine headache, right hand numbness  CT HEAD WITHOUT CONTRAST  Technique:  Contiguous axial images were obtained from the base of the skull through the vertex without contrast.  Comparison: None  Findings: Mild atrophy. Normal ventricular morphology. No midline shift or mass effect. Suspect prominent perivascular space at inferior left basal ganglia. No intracranial hemorrhage, mass lesion or evidence of acute infarction identified. No extra-axial fluid collections. Visualized paranasal sinuses and  mastoid air cells clear. Bones unremarkable.  IMPRESSION: No acute intracranial abnormalities.  Original Report Authenticated By: Lollie Marrow, M.D.     1. Migraine       MDM  7:04 PM Pt seen and examined. Pt with three days of headache. This morning she noticed that her right hand is numb, though she still had movement. She saw her PCP who sent her to ED for further workup. Pt has tried methadone/oxycodone for her headache without relief. She states she is not allowed to take extra tylenol or any NSAIDs (on celebrex). Of note, patient states she has diffuse arthritis and does not have any additional neck pain or decreased movement than normal at her neck. On exam, patient with no clinical signs of meningitis.  9:13 PM Pt states her headache is much improved. Ct head pending.  Pt CT head unremarkable. Pt likely with migraine headache which has improved with migraine cocktail of benadryl/reglan/decadron. Will advise pt to see her doctor in several days if the headache returns. Numbness in hand is more likely related to patient's severe arthritis than to a CVA event as head CT unremarkable.  Daleen Bo 03/01/11 0038

## 2011-02-28 NOTE — ED Notes (Signed)
Per EMS:  PT here with c/o migraine headache since Saturday as well as numbness and tingling in the fingers.  Pt rates pain 10/10.

## 2011-02-28 NOTE — ED Notes (Signed)
Patient transported to CT 

## 2011-02-28 NOTE — ED Notes (Signed)
Pt here with c/o frontal lobe migraine that started on Saturday and ha s become progressively worse.  Pt rates pain 10/10 and reports nausea with the pain.  Pt denies any vision changes as welll as vomiting and photophobia.  Pt alert and oriented x 4.

## 2011-02-28 NOTE — ED Provider Notes (Signed)
I saw and evaluated the patient, reviewed the resident's note and I agree with the findings and plan.  Patient headache most likely secondary to her history of migraines patient feeling better at this time. Patient prefers to go home which is appropriate.  Shelda Jakes, MD 02/28/11 2251

## 2011-04-21 DIAGNOSIS — L4050 Arthropathic psoriasis, unspecified: Secondary | ICD-10-CM | POA: Diagnosis not present

## 2011-04-21 DIAGNOSIS — IMO0001 Reserved for inherently not codable concepts without codable children: Secondary | ICD-10-CM | POA: Diagnosis not present

## 2011-04-21 DIAGNOSIS — M533 Sacrococcygeal disorders, not elsewhere classified: Secondary | ICD-10-CM | POA: Diagnosis not present

## 2011-04-21 DIAGNOSIS — M4802 Spinal stenosis, cervical region: Secondary | ICD-10-CM | POA: Diagnosis not present

## 2011-05-18 DIAGNOSIS — Z79899 Other long term (current) drug therapy: Secondary | ICD-10-CM | POA: Diagnosis not present

## 2011-05-18 DIAGNOSIS — R5381 Other malaise: Secondary | ICD-10-CM | POA: Diagnosis not present

## 2011-05-18 DIAGNOSIS — E039 Hypothyroidism, unspecified: Secondary | ICD-10-CM | POA: Diagnosis not present

## 2011-05-19 DIAGNOSIS — M4802 Spinal stenosis, cervical region: Secondary | ICD-10-CM | POA: Diagnosis not present

## 2011-05-19 DIAGNOSIS — M533 Sacrococcygeal disorders, not elsewhere classified: Secondary | ICD-10-CM | POA: Diagnosis not present

## 2011-05-19 DIAGNOSIS — M545 Low back pain, unspecified: Secondary | ICD-10-CM | POA: Diagnosis not present

## 2011-05-19 DIAGNOSIS — Z5181 Encounter for therapeutic drug level monitoring: Secondary | ICD-10-CM | POA: Diagnosis not present

## 2011-05-19 DIAGNOSIS — IMO0001 Reserved for inherently not codable concepts without codable children: Secondary | ICD-10-CM | POA: Diagnosis not present

## 2011-05-19 DIAGNOSIS — L4050 Arthropathic psoriasis, unspecified: Secondary | ICD-10-CM | POA: Diagnosis not present

## 2011-05-20 DIAGNOSIS — L4050 Arthropathic psoriasis, unspecified: Secondary | ICD-10-CM | POA: Diagnosis not present

## 2011-05-20 DIAGNOSIS — M159 Polyosteoarthritis, unspecified: Secondary | ICD-10-CM | POA: Diagnosis not present

## 2011-06-21 DIAGNOSIS — E782 Mixed hyperlipidemia: Secondary | ICD-10-CM | POA: Diagnosis not present

## 2011-06-21 DIAGNOSIS — Z79899 Other long term (current) drug therapy: Secondary | ICD-10-CM | POA: Diagnosis not present

## 2011-06-23 DIAGNOSIS — M533 Sacrococcygeal disorders, not elsewhere classified: Secondary | ICD-10-CM | POA: Diagnosis not present

## 2011-06-23 DIAGNOSIS — IMO0001 Reserved for inherently not codable concepts without codable children: Secondary | ICD-10-CM | POA: Diagnosis not present

## 2011-06-23 DIAGNOSIS — I10 Essential (primary) hypertension: Secondary | ICD-10-CM | POA: Diagnosis not present

## 2011-06-23 DIAGNOSIS — L4050 Arthropathic psoriasis, unspecified: Secondary | ICD-10-CM | POA: Diagnosis not present

## 2011-06-23 DIAGNOSIS — Z79899 Other long term (current) drug therapy: Secondary | ICD-10-CM | POA: Diagnosis not present

## 2011-06-23 DIAGNOSIS — M899 Disorder of bone, unspecified: Secondary | ICD-10-CM | POA: Diagnosis not present

## 2011-06-23 DIAGNOSIS — M949 Disorder of cartilage, unspecified: Secondary | ICD-10-CM | POA: Diagnosis not present

## 2011-06-23 DIAGNOSIS — M4802 Spinal stenosis, cervical region: Secondary | ICD-10-CM | POA: Diagnosis not present

## 2011-06-23 DIAGNOSIS — E782 Mixed hyperlipidemia: Secondary | ICD-10-CM | POA: Diagnosis not present

## 2011-07-21 DIAGNOSIS — M545 Low back pain, unspecified: Secondary | ICD-10-CM | POA: Diagnosis not present

## 2011-07-21 DIAGNOSIS — L4050 Arthropathic psoriasis, unspecified: Secondary | ICD-10-CM | POA: Diagnosis not present

## 2011-07-21 DIAGNOSIS — M4802 Spinal stenosis, cervical region: Secondary | ICD-10-CM | POA: Diagnosis not present

## 2011-07-21 DIAGNOSIS — IMO0001 Reserved for inherently not codable concepts without codable children: Secondary | ICD-10-CM | POA: Diagnosis not present

## 2011-07-21 DIAGNOSIS — M533 Sacrococcygeal disorders, not elsewhere classified: Secondary | ICD-10-CM | POA: Diagnosis not present

## 2011-07-21 DIAGNOSIS — I10 Essential (primary) hypertension: Secondary | ICD-10-CM | POA: Diagnosis not present

## 2011-07-21 DIAGNOSIS — E7800 Pure hypercholesterolemia, unspecified: Secondary | ICD-10-CM | POA: Diagnosis not present

## 2011-08-18 DIAGNOSIS — M545 Low back pain, unspecified: Secondary | ICD-10-CM | POA: Diagnosis not present

## 2011-08-18 DIAGNOSIS — IMO0001 Reserved for inherently not codable concepts without codable children: Secondary | ICD-10-CM | POA: Diagnosis not present

## 2011-08-18 DIAGNOSIS — M533 Sacrococcygeal disorders, not elsewhere classified: Secondary | ICD-10-CM | POA: Diagnosis not present

## 2011-08-18 DIAGNOSIS — M4802 Spinal stenosis, cervical region: Secondary | ICD-10-CM | POA: Diagnosis not present

## 2011-09-15 DIAGNOSIS — M533 Sacrococcygeal disorders, not elsewhere classified: Secondary | ICD-10-CM | POA: Diagnosis not present

## 2011-09-15 DIAGNOSIS — IMO0001 Reserved for inherently not codable concepts without codable children: Secondary | ICD-10-CM | POA: Diagnosis not present

## 2011-09-15 DIAGNOSIS — M4802 Spinal stenosis, cervical region: Secondary | ICD-10-CM | POA: Diagnosis not present

## 2011-09-15 DIAGNOSIS — L4050 Arthropathic psoriasis, unspecified: Secondary | ICD-10-CM | POA: Diagnosis not present

## 2011-11-10 DIAGNOSIS — M4802 Spinal stenosis, cervical region: Secondary | ICD-10-CM | POA: Diagnosis not present

## 2011-11-10 DIAGNOSIS — M545 Low back pain, unspecified: Secondary | ICD-10-CM | POA: Diagnosis not present

## 2011-11-10 DIAGNOSIS — L4050 Arthropathic psoriasis, unspecified: Secondary | ICD-10-CM | POA: Diagnosis not present

## 2011-11-10 DIAGNOSIS — IMO0001 Reserved for inherently not codable concepts without codable children: Secondary | ICD-10-CM | POA: Diagnosis not present

## 2011-11-10 DIAGNOSIS — I10 Essential (primary) hypertension: Secondary | ICD-10-CM | POA: Diagnosis not present

## 2011-11-10 DIAGNOSIS — E782 Mixed hyperlipidemia: Secondary | ICD-10-CM | POA: Diagnosis not present

## 2011-11-10 DIAGNOSIS — M533 Sacrococcygeal disorders, not elsewhere classified: Secondary | ICD-10-CM | POA: Diagnosis not present

## 2011-11-14 DIAGNOSIS — B370 Candidal stomatitis: Secondary | ICD-10-CM | POA: Diagnosis not present

## 2011-11-14 DIAGNOSIS — M159 Polyosteoarthritis, unspecified: Secondary | ICD-10-CM | POA: Diagnosis not present

## 2011-11-14 DIAGNOSIS — L4050 Arthropathic psoriasis, unspecified: Secondary | ICD-10-CM | POA: Diagnosis not present

## 2011-11-17 DIAGNOSIS — Z043 Encounter for examination and observation following other accident: Secondary | ICD-10-CM | POA: Diagnosis not present

## 2011-11-17 DIAGNOSIS — S9030XA Contusion of unspecified foot, initial encounter: Secondary | ICD-10-CM | POA: Diagnosis not present

## 2011-11-17 DIAGNOSIS — M79609 Pain in unspecified limb: Secondary | ICD-10-CM | POA: Diagnosis not present

## 2011-11-17 DIAGNOSIS — S9000XA Contusion of unspecified ankle, initial encounter: Secondary | ICD-10-CM | POA: Diagnosis not present

## 2012-01-02 DIAGNOSIS — M26609 Unspecified temporomandibular joint disorder, unspecified side: Secondary | ICD-10-CM | POA: Diagnosis not present

## 2012-01-02 DIAGNOSIS — M810 Age-related osteoporosis without current pathological fracture: Secondary | ICD-10-CM | POA: Diagnosis not present

## 2012-01-02 DIAGNOSIS — J45909 Unspecified asthma, uncomplicated: Secondary | ICD-10-CM | POA: Diagnosis not present

## 2012-01-03 DIAGNOSIS — J45909 Unspecified asthma, uncomplicated: Secondary | ICD-10-CM | POA: Diagnosis not present

## 2012-01-03 DIAGNOSIS — Z1231 Encounter for screening mammogram for malignant neoplasm of breast: Secondary | ICD-10-CM | POA: Diagnosis not present

## 2012-01-05 DIAGNOSIS — M545 Low back pain, unspecified: Secondary | ICD-10-CM | POA: Diagnosis not present

## 2012-01-05 DIAGNOSIS — M4802 Spinal stenosis, cervical region: Secondary | ICD-10-CM | POA: Diagnosis not present

## 2012-01-05 DIAGNOSIS — IMO0001 Reserved for inherently not codable concepts without codable children: Secondary | ICD-10-CM | POA: Diagnosis not present

## 2012-01-05 DIAGNOSIS — L4050 Arthropathic psoriasis, unspecified: Secondary | ICD-10-CM | POA: Diagnosis not present

## 2012-01-06 DIAGNOSIS — D72829 Elevated white blood cell count, unspecified: Secondary | ICD-10-CM | POA: Diagnosis not present

## 2012-01-06 DIAGNOSIS — R059 Cough, unspecified: Secondary | ICD-10-CM | POA: Diagnosis not present

## 2012-01-06 DIAGNOSIS — J45909 Unspecified asthma, uncomplicated: Secondary | ICD-10-CM | POA: Diagnosis not present

## 2012-01-06 DIAGNOSIS — R05 Cough: Secondary | ICD-10-CM | POA: Diagnosis not present

## 2012-01-10 DIAGNOSIS — I6529 Occlusion and stenosis of unspecified carotid artery: Secondary | ICD-10-CM | POA: Diagnosis not present

## 2012-01-12 ENCOUNTER — Ambulatory Visit
Admission: RE | Admit: 2012-01-12 | Discharge: 2012-01-12 | Disposition: A | Discharge status: Home / Self Care | Payer: Medicare Other | Source: Ambulatory Visit | Attending: Internal Medicine | Admitting: Internal Medicine

## 2012-01-12 ENCOUNTER — Other Ambulatory Visit: Payer: Self-pay | Admitting: Internal Medicine

## 2012-01-12 DIAGNOSIS — R490 Dysphonia: Secondary | ICD-10-CM | POA: Diagnosis not present

## 2012-01-12 DIAGNOSIS — K219 Gastro-esophageal reflux disease without esophagitis: Secondary | ICD-10-CM | POA: Diagnosis not present

## 2012-01-12 DIAGNOSIS — E049 Nontoxic goiter, unspecified: Secondary | ICD-10-CM

## 2012-01-12 DIAGNOSIS — D72829 Elevated white blood cell count, unspecified: Secondary | ICD-10-CM | POA: Diagnosis not present

## 2012-01-12 DIAGNOSIS — J45909 Unspecified asthma, uncomplicated: Secondary | ICD-10-CM | POA: Diagnosis not present

## 2012-01-12 DIAGNOSIS — E041 Nontoxic single thyroid nodule: Secondary | ICD-10-CM

## 2012-01-30 DIAGNOSIS — I10 Essential (primary) hypertension: Secondary | ICD-10-CM | POA: Diagnosis not present

## 2012-01-30 DIAGNOSIS — R109 Unspecified abdominal pain: Secondary | ICD-10-CM | POA: Diagnosis not present

## 2012-01-30 DIAGNOSIS — F17200 Nicotine dependence, unspecified, uncomplicated: Secondary | ICD-10-CM | POA: Diagnosis not present

## 2012-01-30 DIAGNOSIS — I729 Aneurysm of unspecified site: Secondary | ICD-10-CM | POA: Diagnosis not present

## 2012-01-30 DIAGNOSIS — M549 Dorsalgia, unspecified: Secondary | ICD-10-CM | POA: Diagnosis not present

## 2012-02-29 DIAGNOSIS — E7800 Pure hypercholesterolemia, unspecified: Secondary | ICD-10-CM | POA: Diagnosis not present

## 2012-02-29 DIAGNOSIS — IMO0001 Reserved for inherently not codable concepts without codable children: Secondary | ICD-10-CM | POA: Diagnosis not present

## 2012-02-29 DIAGNOSIS — Z5181 Encounter for therapeutic drug level monitoring: Secondary | ICD-10-CM | POA: Diagnosis not present

## 2012-02-29 DIAGNOSIS — I700 Atherosclerosis of aorta: Secondary | ICD-10-CM | POA: Diagnosis not present

## 2012-02-29 DIAGNOSIS — M545 Low back pain, unspecified: Secondary | ICD-10-CM | POA: Diagnosis not present

## 2012-02-29 DIAGNOSIS — L4050 Arthropathic psoriasis, unspecified: Secondary | ICD-10-CM | POA: Diagnosis not present

## 2012-02-29 DIAGNOSIS — R238 Other skin changes: Secondary | ICD-10-CM | POA: Diagnosis not present

## 2012-02-29 DIAGNOSIS — M129 Arthropathy, unspecified: Secondary | ICD-10-CM | POA: Diagnosis not present

## 2012-02-29 DIAGNOSIS — M4802 Spinal stenosis, cervical region: Secondary | ICD-10-CM | POA: Diagnosis not present

## 2012-03-05 DIAGNOSIS — H40019 Open angle with borderline findings, low risk, unspecified eye: Secondary | ICD-10-CM | POA: Diagnosis not present

## 2012-03-29 DIAGNOSIS — R490 Dysphonia: Secondary | ICD-10-CM | POA: Diagnosis not present

## 2012-03-29 DIAGNOSIS — J029 Acute pharyngitis, unspecified: Secondary | ICD-10-CM | POA: Diagnosis not present

## 2012-03-29 DIAGNOSIS — K219 Gastro-esophageal reflux disease without esophagitis: Secondary | ICD-10-CM | POA: Diagnosis not present

## 2012-04-20 DIAGNOSIS — L4050 Arthropathic psoriasis, unspecified: Secondary | ICD-10-CM | POA: Diagnosis not present

## 2012-04-20 DIAGNOSIS — M545 Low back pain, unspecified: Secondary | ICD-10-CM | POA: Diagnosis not present

## 2012-04-20 DIAGNOSIS — M899 Disorder of bone, unspecified: Secondary | ICD-10-CM | POA: Diagnosis not present

## 2012-04-20 DIAGNOSIS — M159 Polyosteoarthritis, unspecified: Secondary | ICD-10-CM | POA: Diagnosis not present

## 2012-04-20 DIAGNOSIS — Z23 Encounter for immunization: Secondary | ICD-10-CM | POA: Diagnosis not present

## 2012-04-20 DIAGNOSIS — I10 Essential (primary) hypertension: Secondary | ICD-10-CM | POA: Diagnosis not present

## 2012-04-20 DIAGNOSIS — E7800 Pure hypercholesterolemia, unspecified: Secondary | ICD-10-CM | POA: Diagnosis not present

## 2012-04-20 DIAGNOSIS — M949 Disorder of cartilage, unspecified: Secondary | ICD-10-CM | POA: Diagnosis not present

## 2012-04-20 DIAGNOSIS — E039 Hypothyroidism, unspecified: Secondary | ICD-10-CM | POA: Diagnosis not present

## 2012-04-25 DIAGNOSIS — L4050 Arthropathic psoriasis, unspecified: Secondary | ICD-10-CM | POA: Diagnosis not present

## 2012-04-25 DIAGNOSIS — M4802 Spinal stenosis, cervical region: Secondary | ICD-10-CM | POA: Diagnosis not present

## 2012-04-25 DIAGNOSIS — M533 Sacrococcygeal disorders, not elsewhere classified: Secondary | ICD-10-CM | POA: Diagnosis not present

## 2012-04-25 DIAGNOSIS — IMO0001 Reserved for inherently not codable concepts without codable children: Secondary | ICD-10-CM | POA: Diagnosis not present

## 2012-05-16 DIAGNOSIS — L4050 Arthropathic psoriasis, unspecified: Secondary | ICD-10-CM | POA: Diagnosis not present

## 2012-05-16 DIAGNOSIS — M159 Polyosteoarthritis, unspecified: Secondary | ICD-10-CM | POA: Diagnosis not present

## 2012-05-16 DIAGNOSIS — M2550 Pain in unspecified joint: Secondary | ICD-10-CM | POA: Diagnosis not present

## 2012-05-16 DIAGNOSIS — B370 Candidal stomatitis: Secondary | ICD-10-CM | POA: Diagnosis not present

## 2012-06-04 DIAGNOSIS — M79609 Pain in unspecified limb: Secondary | ICD-10-CM | POA: Diagnosis not present

## 2012-06-04 DIAGNOSIS — R609 Edema, unspecified: Secondary | ICD-10-CM | POA: Diagnosis not present

## 2012-06-04 DIAGNOSIS — R42 Dizziness and giddiness: Secondary | ICD-10-CM | POA: Diagnosis not present

## 2012-06-04 DIAGNOSIS — L0390 Cellulitis, unspecified: Secondary | ICD-10-CM | POA: Diagnosis not present

## 2012-06-04 DIAGNOSIS — L0291 Cutaneous abscess, unspecified: Secondary | ICD-10-CM | POA: Diagnosis not present

## 2012-06-20 DIAGNOSIS — L4050 Arthropathic psoriasis, unspecified: Secondary | ICD-10-CM | POA: Diagnosis not present

## 2012-06-20 DIAGNOSIS — M2550 Pain in unspecified joint: Secondary | ICD-10-CM | POA: Diagnosis not present

## 2012-06-20 DIAGNOSIS — IMO0001 Reserved for inherently not codable concepts without codable children: Secondary | ICD-10-CM | POA: Diagnosis not present

## 2012-06-20 DIAGNOSIS — M4802 Spinal stenosis, cervical region: Secondary | ICD-10-CM | POA: Diagnosis not present

## 2012-06-20 DIAGNOSIS — M129 Arthropathy, unspecified: Secondary | ICD-10-CM | POA: Diagnosis not present

## 2012-06-21 DIAGNOSIS — L4050 Arthropathic psoriasis, unspecified: Secondary | ICD-10-CM | POA: Diagnosis not present

## 2012-06-21 DIAGNOSIS — M2550 Pain in unspecified joint: Secondary | ICD-10-CM | POA: Diagnosis not present

## 2012-06-21 DIAGNOSIS — M159 Polyosteoarthritis, unspecified: Secondary | ICD-10-CM | POA: Diagnosis not present

## 2012-07-20 DIAGNOSIS — L4050 Arthropathic psoriasis, unspecified: Secondary | ICD-10-CM | POA: Diagnosis not present

## 2012-07-20 DIAGNOSIS — M159 Polyosteoarthritis, unspecified: Secondary | ICD-10-CM | POA: Diagnosis not present

## 2012-08-15 DIAGNOSIS — M533 Sacrococcygeal disorders, not elsewhere classified: Secondary | ICD-10-CM | POA: Diagnosis not present

## 2012-08-15 DIAGNOSIS — L4050 Arthropathic psoriasis, unspecified: Secondary | ICD-10-CM | POA: Diagnosis not present

## 2012-08-15 DIAGNOSIS — IMO0001 Reserved for inherently not codable concepts without codable children: Secondary | ICD-10-CM | POA: Diagnosis not present

## 2012-08-15 DIAGNOSIS — N390 Urinary tract infection, site not specified: Secondary | ICD-10-CM | POA: Diagnosis not present

## 2012-08-15 DIAGNOSIS — M129 Arthropathy, unspecified: Secondary | ICD-10-CM | POA: Diagnosis not present

## 2012-09-17 DIAGNOSIS — M159 Polyosteoarthritis, unspecified: Secondary | ICD-10-CM | POA: Diagnosis not present

## 2012-09-17 DIAGNOSIS — L4050 Arthropathic psoriasis, unspecified: Secondary | ICD-10-CM | POA: Diagnosis not present

## 2012-10-16 DIAGNOSIS — I10 Essential (primary) hypertension: Secondary | ICD-10-CM | POA: Diagnosis not present

## 2012-10-16 DIAGNOSIS — Z0000 Encounter for general adult medical examination without abnormal findings: Secondary | ICD-10-CM | POA: Diagnosis not present

## 2012-10-16 DIAGNOSIS — E7800 Pure hypercholesterolemia, unspecified: Secondary | ICD-10-CM | POA: Diagnosis not present

## 2012-10-16 DIAGNOSIS — E039 Hypothyroidism, unspecified: Secondary | ICD-10-CM | POA: Diagnosis not present

## 2012-10-19 DIAGNOSIS — E7800 Pure hypercholesterolemia, unspecified: Secondary | ICD-10-CM | POA: Diagnosis not present

## 2012-10-19 DIAGNOSIS — I10 Essential (primary) hypertension: Secondary | ICD-10-CM | POA: Diagnosis not present

## 2012-10-19 DIAGNOSIS — Z79899 Other long term (current) drug therapy: Secondary | ICD-10-CM | POA: Diagnosis not present

## 2012-10-19 DIAGNOSIS — I700 Atherosclerosis of aorta: Secondary | ICD-10-CM | POA: Diagnosis not present

## 2012-10-19 DIAGNOSIS — Z006 Encounter for examination for normal comparison and control in clinical research program: Secondary | ICD-10-CM | POA: Diagnosis not present

## 2012-10-19 DIAGNOSIS — F17200 Nicotine dependence, unspecified, uncomplicated: Secondary | ICD-10-CM | POA: Diagnosis not present

## 2012-10-19 DIAGNOSIS — D72829 Elevated white blood cell count, unspecified: Secondary | ICD-10-CM | POA: Diagnosis not present

## 2012-10-19 DIAGNOSIS — E559 Vitamin D deficiency, unspecified: Secondary | ICD-10-CM | POA: Diagnosis not present

## 2012-10-19 DIAGNOSIS — E039 Hypothyroidism, unspecified: Secondary | ICD-10-CM | POA: Diagnosis not present

## 2012-10-24 DIAGNOSIS — IMO0001 Reserved for inherently not codable concepts without codable children: Secondary | ICD-10-CM | POA: Diagnosis not present

## 2012-10-24 DIAGNOSIS — M129 Arthropathy, unspecified: Secondary | ICD-10-CM | POA: Diagnosis not present

## 2012-10-24 DIAGNOSIS — L4050 Arthropathic psoriasis, unspecified: Secondary | ICD-10-CM | POA: Diagnosis not present

## 2012-10-24 DIAGNOSIS — M4802 Spinal stenosis, cervical region: Secondary | ICD-10-CM | POA: Diagnosis not present

## 2012-11-18 ENCOUNTER — Encounter: Payer: Self-pay | Admitting: *Deleted

## 2012-11-21 ENCOUNTER — Encounter: Payer: Self-pay | Admitting: Cardiovascular Disease

## 2012-11-22 ENCOUNTER — Encounter: Payer: Self-pay | Admitting: Cardiovascular Disease

## 2012-11-22 ENCOUNTER — Ambulatory Visit (INDEPENDENT_AMBULATORY_CARE_PROVIDER_SITE_OTHER): Payer: Medicare Other | Admitting: Cardiovascular Disease

## 2012-11-22 VITALS — BP 136/78 | HR 70 | Resp 16 | Ht 63.0 in | Wt 186.1 lb

## 2012-11-22 DIAGNOSIS — E785 Hyperlipidemia, unspecified: Secondary | ICD-10-CM | POA: Insufficient documentation

## 2012-11-22 DIAGNOSIS — I447 Left bundle-branch block, unspecified: Secondary | ICD-10-CM | POA: Diagnosis not present

## 2012-11-22 DIAGNOSIS — F17200 Nicotine dependence, unspecified, uncomplicated: Secondary | ICD-10-CM | POA: Diagnosis not present

## 2012-11-22 DIAGNOSIS — Z720 Tobacco use: Secondary | ICD-10-CM

## 2012-11-22 DIAGNOSIS — I10 Essential (primary) hypertension: Secondary | ICD-10-CM | POA: Insufficient documentation

## 2012-11-22 DIAGNOSIS — R55 Syncope and collapse: Secondary | ICD-10-CM | POA: Insufficient documentation

## 2012-11-22 NOTE — Patient Instructions (Signed)
Your physician recommends that you schedule a follow-up appointment in: 1 year Your physician discussed the hazards of tobacco use. Tobacco use cessation is recommended and techniques and options to help you quit were discussed.   If you lose consciousness or come close to passing out, please call us to discuss implantation of a loop recorder.

## 2012-11-27 ENCOUNTER — Encounter: Payer: Self-pay | Admitting: Cardiovascular Disease

## 2012-11-27 NOTE — Assessment & Plan Note (Signed)
Chronic, not associated with cardiomyopathy or symptoms of high grade AV block.Currently not on negative dromotropic meds.

## 2012-11-27 NOTE — Progress Notes (Signed)
Patient ID: [name], female   DOB: 28-Aug-1955, 57 y.o.   MRN: [id number]     Reason for office visit History of presyncope, hypertension, hyperlipidemia, left bundle branch block  [name] is fairly done well since her last office appointment one year ago. She has not had any new episodes of presyncope or syncope. She rarely gets dizzy although distal happens mostly with changes in position. For the most part she is troubled by the arthralgias related to her rheumatological disorder(this seems to be a combination of psoriatic and rheumatoid arthritis), as well as the side effects of Humira and Symponia. I understand she is going back to treatment with Enbrel.  She has a long-standing left bundle branch block (an incomplete left bundle branch block documented as far back as 2007, now with a QRS duration of about 160 ms), but her episodes of presyncope seem to have a pattern more consistent with a vasovagal events rather than heart block.  Unfortunate she continues to smoke. As on previous visits we spent a long time discussing the need to quit smoking and different modalities to achieve this.  She has occasional dyspnea that is relieved promptly by her bronchodilator inhaler. Rare evidence of mild lower extremity edema. This resolves spontaneously without changes in diuretic therapy.    Allergies  Allergen Reactions  . Arava [Leflunomide] Other (See Comments)    Heart race   . Aspirin Other (See Comments)    Upset stomach, burning sensation.  . Codeine Nausea And Vomiting and Other (See Comments)    Dizzy,heart speeds up  . Darvocet [Propoxyphene-Acetaminophen] Other (See Comments)    Heart races  . Fioricet [Butalbital-Apap-Caffeine] Other (See Comments)    Heart races.  . Levofloxacin Nausea And Vomiting  . Sertraline Hcl Other (See Comments)    Patient states "like a movie going on and can see killing someone else"  . Sulfa Antibiotics Other (See Comments)    Unknown.  Long  time ago.    Current Outpatient Prescriptions  Medication Sig Dispense Refill  . atorvastatin (LIPITOR) 40 MG tablet Take 40 mg by mouth daily.        . celecoxib (CELEBREX) 200 MG capsule Take 200 mg by mouth daily.        Marland Kitchen etanercept (ENBREL) 50 MG/ML injection Inject 50 mg into the skin 2 (two) times a week. Tuesday and Friday.      . gabapentin (NEURONTIN) 600 MG tablet Take 600 mg by mouth 3 (three) times daily.       Marland Kitchen levothyroxine (SYNTHROID, LEVOTHROID) 112 MCG tablet Take 112 mcg by mouth daily.        . methadone (DOLOPHINE) 10 MG tablet Take 10 mg by mouth 3 (three) times daily.        . Multiple Vitamins-Minerals (MULTIVITAMINS THER. W/MINERALS) TABS Take 1 tablet by mouth daily.        Marland Kitchen omega-3 acid ethyl esters (LOVAZA) 1 G capsule Take 3 g by mouth daily.        Marland Kitchen oxyCODONE-acetaminophen (PERCOCET) 10-325 MG per tablet Take 1 tablet by mouth 3 (three) times daily as needed. For pain       . tiZANidine (ZANAFLEX) 4 MG tablet Take 4-8 mg by mouth 4 (four) times daily.        . valsartan-hydrochlorothiazide (DIOVAN-HCT) 160-25 MG per tablet Take 1 tablet by mouth daily.        Marland Kitchen LIDODERM 5 % daily as needed.      Marland Kitchen  ZETIA 10 MG tablet Take 10 mg by mouth daily.       No current facility-administered medications for this visit.    Past Medical History  Diagnosis Date  . Psoriasis   . Chronic back pain   . Hypertension   . Hypercholesteremia   . Osteopenia   . Hypothyroid   . Fibromyalgia   . Addiction, opium   . GERD (gastroesophageal reflux disease)   . Laryngitis   . LBBB (left bundle branch block)     Past Surgical History  Procedure Laterality Date  . Cesarean section    . Appendectomy    . Laparoscopic ovarian cystectomy    . Tonsillectomy    . Cardiac catheterization  06/18/2002    normal  . US echocardiography  08/05/2010    normal  . Nm myocar perf wall motion  07/17/2009    fixed anteroseptal defect,no ischemia    Family History  Problem Relation  Age of Onset  . Hypertension Mother   . Heart failure Mother   . Diabetes Mother   . Diabetes Father   . Heart failure Father   . Hypertension Father   . Cancer Brother     brain  . Cancer Brother     lung  . Hypertension Brother   . Hypertension Sister     History   Social History  . Marital Status: Divorced    Spouse Name: N/A    Number of Children: N/A  . Years of Education: N/A   Occupational History  . Not on file.   Social History Main Topics  . Smoking status: Current Every Day Smoker -- 1.00 packs/day  . Smokeless tobacco: Not on file  . Alcohol Use: No  . Drug Use: No  . Sexual Activity: Not on file   Other Topics Concern  . Not on file   Social History Narrative  . No narrative on file    Review of systems: The patient specifically denies any chest pain at rest or with exertion,  orthopnea, paroxysmal nocturnal dyspnea, syncope, palpitations, focal neurological deficits, intermittent claudication,  unexplained weight gain, cough, hemoptysis.  The patient also denies abdominal pain, nausea, vomiting, dysphagia, diarrhea, constipation, polyuria, polydipsia, dysuria, hematuria, frequency, urgency, abnormal bleeding or bruising, fever, chills, unexpected weight changes, mood swings, change in skin or hair texture, change in voice quality, auditory or visual problems, allergic reactions or rashes.   PHYSICAL EXAM BP 136/78  Pulse 70  Resp 16  Ht 5\' 3"  (1.6 m)  Wt 186 lb 1.6 oz (84.414 kg)  BMI 32.97 kg/m2  General: Alert, oriented x3, no distress Head: no evidence of trauma, PERRL, EOMI, no exophtalmos or lid lag, no myxedema, no xanthelasma; normal ears, nose and oropharynx Neck: normal jugular venous pulsations and no hepatojugular reflux; brisk carotid pulses without delay and no carotid bruits Chest: clear to auscultation, no signs of consolidation by percussion or palpation, normal fremitus, symmetrical and full respiratory  excursions Cardiovascular: normal position and quality of the apical impulse, regular rhythm, normal first and paradoxically split second heart sounds, no murmurs, rubs or gallops Abdomen: no tenderness or distention, no masses by palpation, no abnormal pulsatility or arterial bruits, normal bowel sounds, no hepatosplenomegaly Extremities: Relatively mild deformities of her interphalangeal joints ; no clubbing, cyanosis or edema; 2+ radial, ulnar and brachial pulses bilaterally; 2+ right femoral, posterior tibial and dorsalis pedis pulses; 2+ left femoral, posterior tibial and dorsalis pedis pulses; no subclavian or femoral bruits Neurological: grossly nonfocal  EKG: Sinus rhythm, left bundle branch block  Lipid Panel     Component Value Date/Time   CHOL  Value: 139        ATP III CLASSIFICATION:  <200     mg/dL   Desirable  454-098  mg/dL   Borderline High  >=119    mg/dL   High        04/15/7827 0210   TRIG 125 08/14/2008 0210   HDL 28* 08/14/2008 0210   CHOLHDL 5.0 08/14/2008 0210   VLDL 25 08/14/2008 0210   LDLCALC  Value: 86        Total Cholesterol/HDL:CHD Risk Coronary Heart Disease Risk Table                     Men   Women  1/2 Average Risk   3.4   3.3  Average Risk       5.0   4.4  2 X Average Risk   9.6   7.1  3 X Average Risk  23.4   11.0        Use the calculated Patient Ratio above and the CHD Risk Table to determine the patient's CHD Risk.        ATP III CLASSIFICATION (LDL):  <100     mg/dL   Optimal  562-130  mg/dL   Near or Above                    Optimal  130-159  mg/dL   Borderline  865-784  mg/dL   High  >696     mg/dL   Very High 05/20/5282 1324    BMET    Component Value Date/Time   NA 138 02/28/2011 1933   K 3.8 02/28/2011 1933   CL 100 02/28/2011 1933   CO2 28 02/28/2011 1933   GLUCOSE 102* 02/28/2011 1933   BUN 8 02/28/2011 1933   CREATININE 0.71 02/28/2011 1933   CALCIUM 9.9 02/28/2011 1933   GFRNONAA >90 02/28/2011 1933   GFRAA >90 02/28/2011 1933     ASSESSMENT  AND PLAN LBBB (left bundle branch block) Chronic, not associated with cardiomyopathy or symptoms of high grade AV block.Currently not on negative dromotropic meds.  HTN (hypertension) Adequate control. Note adverse effects of celecoxib and other NSAIDs on BP, but she cannot function without it.  Hyperlipidemia Last labs I have available were favorable. She reports Dr. Renne Crigler was pleased with her latest lipid profile. Will get report.  Tobacco abuse Discussed need for smoking cessation and ways to achieve this at length.  Orders Placed This Encounter  Procedures  . EKG 12-Lead   Meds ordered this encounter  Medications  . ZETIA 10 MG tablet    Sig: Take 10 mg by mouth daily.  Marland Kitchen LIDODERM 5 %    Sig: daily as needed.    Junious Silk, MD, [facility] [phone] office [phone] pager

## 2012-11-27 NOTE — Assessment & Plan Note (Signed)
Last labs I have available were favorable. She reports Dr. Renne Crigler was pleased with her latest lipid profile. Will get report.

## 2012-11-27 NOTE — Assessment & Plan Note (Signed)
Adequate control. Note adverse effects of celecoxib and other NSAIDs on BP, but she cannot function without it.

## 2012-11-27 NOTE — Assessment & Plan Note (Signed)
Discussed need for smoking cessation and ways to achieve this at length.

## 2012-12-18 DIAGNOSIS — L4050 Arthropathic psoriasis, unspecified: Secondary | ICD-10-CM | POA: Diagnosis not present

## 2012-12-19 DIAGNOSIS — IMO0001 Reserved for inherently not codable concepts without codable children: Secondary | ICD-10-CM | POA: Diagnosis not present

## 2012-12-19 DIAGNOSIS — M129 Arthropathy, unspecified: Secondary | ICD-10-CM | POA: Diagnosis not present

## 2012-12-19 DIAGNOSIS — L4050 Arthropathic psoriasis, unspecified: Secondary | ICD-10-CM | POA: Diagnosis not present

## 2012-12-19 DIAGNOSIS — M533 Sacrococcygeal disorders, not elsewhere classified: Secondary | ICD-10-CM | POA: Diagnosis not present

## 2012-12-19 DIAGNOSIS — M4802 Spinal stenosis, cervical region: Secondary | ICD-10-CM | POA: Diagnosis not present

## 2012-12-20 DIAGNOSIS — M159 Polyosteoarthritis, unspecified: Secondary | ICD-10-CM | POA: Diagnosis not present

## 2012-12-20 DIAGNOSIS — M25549 Pain in joints of unspecified hand: Secondary | ICD-10-CM | POA: Diagnosis not present

## 2012-12-20 DIAGNOSIS — J45909 Unspecified asthma, uncomplicated: Secondary | ICD-10-CM | POA: Diagnosis not present

## 2012-12-20 DIAGNOSIS — L4050 Arthropathic psoriasis, unspecified: Secondary | ICD-10-CM | POA: Diagnosis not present

## 2012-12-20 DIAGNOSIS — Z23 Encounter for immunization: Secondary | ICD-10-CM | POA: Diagnosis not present

## 2013-02-13 DIAGNOSIS — Z79899 Other long term (current) drug therapy: Secondary | ICD-10-CM | POA: Diagnosis not present

## 2013-02-13 DIAGNOSIS — M545 Low back pain, unspecified: Secondary | ICD-10-CM | POA: Diagnosis not present

## 2013-02-13 DIAGNOSIS — IMO0001 Reserved for inherently not codable concepts without codable children: Secondary | ICD-10-CM | POA: Diagnosis not present

## 2013-02-13 DIAGNOSIS — L4050 Arthropathic psoriasis, unspecified: Secondary | ICD-10-CM | POA: Diagnosis not present

## 2013-02-13 DIAGNOSIS — M4802 Spinal stenosis, cervical region: Secondary | ICD-10-CM | POA: Diagnosis not present

## 2013-02-13 DIAGNOSIS — M533 Sacrococcygeal disorders, not elsewhere classified: Secondary | ICD-10-CM | POA: Diagnosis not present

## 2013-02-20 ENCOUNTER — Other Ambulatory Visit (HOSPITAL_COMMUNITY): Payer: Self-pay | Admitting: Cardiovascular Disease

## 2013-03-13 DIAGNOSIS — M4802 Spinal stenosis, cervical region: Secondary | ICD-10-CM | POA: Diagnosis not present

## 2013-03-13 DIAGNOSIS — L4050 Arthropathic psoriasis, unspecified: Secondary | ICD-10-CM | POA: Diagnosis not present

## 2013-03-13 DIAGNOSIS — M533 Sacrococcygeal disorders, not elsewhere classified: Secondary | ICD-10-CM | POA: Diagnosis not present

## 2013-03-13 DIAGNOSIS — IMO0001 Reserved for inherently not codable concepts without codable children: Secondary | ICD-10-CM | POA: Diagnosis not present

## 2013-03-14 ENCOUNTER — Ambulatory Visit (HOSPITAL_COMMUNITY)
Admission: RE | Admit: 2013-03-14 | Discharge: 2013-03-14 | Disposition: A | Discharge status: Home / Self Care | Payer: Medicare Other | Source: Ambulatory Visit | Attending: Cardiovascular Disease | Admitting: Cardiovascular Disease

## 2013-03-14 DIAGNOSIS — R0989 Other specified symptoms and signs involving the circulatory and respiratory systems: Secondary | ICD-10-CM | POA: Diagnosis not present

## 2013-03-14 DIAGNOSIS — I6529 Occlusion and stenosis of unspecified carotid artery: Secondary | ICD-10-CM | POA: Diagnosis not present

## 2013-03-14 NOTE — Progress Notes (Signed)
Carotid Duplex Completed. Idara Woodside, BS, RDMS, RVT  

## 2013-04-01 DIAGNOSIS — L4050 Arthropathic psoriasis, unspecified: Secondary | ICD-10-CM | POA: Diagnosis not present

## 2013-04-01 DIAGNOSIS — M159 Polyosteoarthritis, unspecified: Secondary | ICD-10-CM | POA: Diagnosis not present

## 2013-05-08 DIAGNOSIS — M4802 Spinal stenosis, cervical region: Secondary | ICD-10-CM | POA: Diagnosis not present

## 2013-05-08 DIAGNOSIS — M533 Sacrococcygeal disorders, not elsewhere classified: Secondary | ICD-10-CM | POA: Diagnosis not present

## 2013-05-08 DIAGNOSIS — L4050 Arthropathic psoriasis, unspecified: Secondary | ICD-10-CM | POA: Diagnosis not present

## 2013-05-08 DIAGNOSIS — IMO0001 Reserved for inherently not codable concepts without codable children: Secondary | ICD-10-CM | POA: Diagnosis not present

## 2013-05-20 DIAGNOSIS — I10 Essential (primary) hypertension: Secondary | ICD-10-CM | POA: Diagnosis not present

## 2013-05-20 DIAGNOSIS — R1013 Epigastric pain: Secondary | ICD-10-CM | POA: Diagnosis not present

## 2013-05-20 DIAGNOSIS — R079 Chest pain, unspecified: Secondary | ICD-10-CM | POA: Diagnosis not present

## 2013-05-20 DIAGNOSIS — R1011 Right upper quadrant pain: Secondary | ICD-10-CM | POA: Diagnosis not present

## 2013-05-20 DIAGNOSIS — F17200 Nicotine dependence, unspecified, uncomplicated: Secondary | ICD-10-CM | POA: Diagnosis not present

## 2013-05-20 DIAGNOSIS — R42 Dizziness and giddiness: Secondary | ICD-10-CM | POA: Diagnosis not present

## 2013-05-20 DIAGNOSIS — E785 Hyperlipidemia, unspecified: Secondary | ICD-10-CM | POA: Diagnosis not present

## 2013-05-20 DIAGNOSIS — M129 Arthropathy, unspecified: Secondary | ICD-10-CM | POA: Diagnosis not present

## 2013-05-20 DIAGNOSIS — R110 Nausea: Secondary | ICD-10-CM | POA: Diagnosis not present

## 2013-06-10 DIAGNOSIS — L4050 Arthropathic psoriasis, unspecified: Secondary | ICD-10-CM | POA: Diagnosis not present

## 2013-06-10 DIAGNOSIS — M4802 Spinal stenosis, cervical region: Secondary | ICD-10-CM | POA: Diagnosis not present

## 2013-06-10 DIAGNOSIS — IMO0001 Reserved for inherently not codable concepts without codable children: Secondary | ICD-10-CM | POA: Diagnosis not present

## 2013-06-10 DIAGNOSIS — M533 Sacrococcygeal disorders, not elsewhere classified: Secondary | ICD-10-CM | POA: Diagnosis not present

## 2013-06-11 DIAGNOSIS — Z1231 Encounter for screening mammogram for malignant neoplasm of breast: Secondary | ICD-10-CM | POA: Diagnosis not present

## 2013-06-13 DIAGNOSIS — R109 Unspecified abdominal pain: Secondary | ICD-10-CM | POA: Diagnosis not present

## 2013-06-13 DIAGNOSIS — R1084 Generalized abdominal pain: Secondary | ICD-10-CM | POA: Diagnosis not present

## 2013-06-13 DIAGNOSIS — R110 Nausea: Secondary | ICD-10-CM | POA: Diagnosis not present

## 2013-06-13 DIAGNOSIS — E876 Hypokalemia: Secondary | ICD-10-CM | POA: Diagnosis not present

## 2013-06-17 DIAGNOSIS — R109 Unspecified abdominal pain: Secondary | ICD-10-CM | POA: Diagnosis not present

## 2013-06-17 DIAGNOSIS — Z1211 Encounter for screening for malignant neoplasm of colon: Secondary | ICD-10-CM | POA: Diagnosis not present

## 2013-07-08 DIAGNOSIS — M4802 Spinal stenosis, cervical region: Secondary | ICD-10-CM | POA: Diagnosis not present

## 2013-07-08 DIAGNOSIS — M533 Sacrococcygeal disorders, not elsewhere classified: Secondary | ICD-10-CM | POA: Diagnosis not present

## 2013-07-08 DIAGNOSIS — M129 Arthropathy, unspecified: Secondary | ICD-10-CM | POA: Diagnosis not present

## 2013-07-08 DIAGNOSIS — IMO0001 Reserved for inherently not codable concepts without codable children: Secondary | ICD-10-CM | POA: Diagnosis not present

## 2013-07-08 DIAGNOSIS — L4050 Arthropathic psoriasis, unspecified: Secondary | ICD-10-CM | POA: Diagnosis not present

## 2013-07-11 ENCOUNTER — Other Ambulatory Visit: Payer: Self-pay | Admitting: Internal Medicine

## 2013-07-11 ENCOUNTER — Ambulatory Visit
Admission: RE | Admit: 2013-07-11 | Discharge: 2013-07-11 | Disposition: A | Discharge status: Home / Self Care | Payer: Medicare Other | Source: Ambulatory Visit | Attending: Internal Medicine | Admitting: Internal Medicine

## 2013-07-11 DIAGNOSIS — R1084 Generalized abdominal pain: Secondary | ICD-10-CM

## 2013-07-11 DIAGNOSIS — N201 Calculus of ureter: Secondary | ICD-10-CM | POA: Diagnosis not present

## 2013-07-11 MED ORDER — IOHEXOL 300 MG/ML  SOLN
100.0000 mL | Freq: Once | INTRAMUSCULAR | Status: AC | PRN
  Administered 2013-07-11: 100 mL via INTRAVENOUS

## 2013-07-11 MED ORDER — IOHEXOL 300 MG/ML  SOLN
30.0000 mL | Freq: Once | INTRAMUSCULAR | Status: AC | PRN
  Administered 2013-07-11: 30 mL via ORAL

## 2013-07-16 DIAGNOSIS — E876 Hypokalemia: Secondary | ICD-10-CM | POA: Diagnosis not present

## 2013-07-23 DIAGNOSIS — E876 Hypokalemia: Secondary | ICD-10-CM | POA: Diagnosis not present

## 2013-07-23 DIAGNOSIS — N200 Calculus of kidney: Secondary | ICD-10-CM | POA: Diagnosis not present

## 2013-07-24 DIAGNOSIS — N200 Calculus of kidney: Secondary | ICD-10-CM | POA: Diagnosis not present

## 2013-07-30 DIAGNOSIS — H40019 Open angle with borderline findings, low risk, unspecified eye: Secondary | ICD-10-CM | POA: Diagnosis not present

## 2013-09-10 DIAGNOSIS — M533 Sacrococcygeal disorders, not elsewhere classified: Secondary | ICD-10-CM | POA: Diagnosis not present

## 2013-09-10 DIAGNOSIS — IMO0001 Reserved for inherently not codable concepts without codable children: Secondary | ICD-10-CM | POA: Diagnosis not present

## 2013-09-10 DIAGNOSIS — M4802 Spinal stenosis, cervical region: Secondary | ICD-10-CM | POA: Diagnosis not present

## 2013-09-10 DIAGNOSIS — L4050 Arthropathic psoriasis, unspecified: Secondary | ICD-10-CM | POA: Diagnosis not present

## 2013-11-07 DIAGNOSIS — IMO0001 Reserved for inherently not codable concepts without codable children: Secondary | ICD-10-CM | POA: Diagnosis not present

## 2013-11-07 DIAGNOSIS — M4802 Spinal stenosis, cervical region: Secondary | ICD-10-CM | POA: Diagnosis not present

## 2013-11-07 DIAGNOSIS — L4050 Arthropathic psoriasis, unspecified: Secondary | ICD-10-CM | POA: Diagnosis not present

## 2013-11-07 DIAGNOSIS — M129 Arthropathy, unspecified: Secondary | ICD-10-CM | POA: Diagnosis not present

## 2013-11-22 DIAGNOSIS — M949 Disorder of cartilage, unspecified: Secondary | ICD-10-CM | POA: Diagnosis not present

## 2013-11-22 DIAGNOSIS — M899 Disorder of bone, unspecified: Secondary | ICD-10-CM | POA: Diagnosis not present

## 2013-11-22 DIAGNOSIS — M159 Polyosteoarthritis, unspecified: Secondary | ICD-10-CM | POA: Diagnosis not present

## 2013-11-22 DIAGNOSIS — E039 Hypothyroidism, unspecified: Secondary | ICD-10-CM | POA: Diagnosis not present

## 2013-11-22 DIAGNOSIS — E559 Vitamin D deficiency, unspecified: Secondary | ICD-10-CM | POA: Diagnosis not present

## 2013-11-22 DIAGNOSIS — E7800 Pure hypercholesterolemia, unspecified: Secondary | ICD-10-CM | POA: Diagnosis not present

## 2013-11-28 DIAGNOSIS — M545 Low back pain, unspecified: Secondary | ICD-10-CM | POA: Diagnosis not present

## 2013-11-28 DIAGNOSIS — I10 Essential (primary) hypertension: Secondary | ICD-10-CM | POA: Diagnosis not present

## 2013-11-28 DIAGNOSIS — M159 Polyosteoarthritis, unspecified: Secondary | ICD-10-CM | POA: Diagnosis not present

## 2013-11-28 DIAGNOSIS — Z79899 Other long term (current) drug therapy: Secondary | ICD-10-CM | POA: Diagnosis not present

## 2013-11-28 DIAGNOSIS — L4050 Arthropathic psoriasis, unspecified: Secondary | ICD-10-CM | POA: Diagnosis not present

## 2013-12-03 DIAGNOSIS — M159 Polyosteoarthritis, unspecified: Secondary | ICD-10-CM | POA: Diagnosis not present

## 2013-12-03 DIAGNOSIS — L4050 Arthropathic psoriasis, unspecified: Secondary | ICD-10-CM | POA: Diagnosis not present

## 2014-01-03 DIAGNOSIS — M533 Sacrococcygeal disorders, not elsewhere classified: Secondary | ICD-10-CM | POA: Diagnosis not present

## 2014-01-03 DIAGNOSIS — M129 Arthropathy, unspecified: Secondary | ICD-10-CM | POA: Diagnosis not present

## 2014-01-03 DIAGNOSIS — M4802 Spinal stenosis, cervical region: Secondary | ICD-10-CM | POA: Diagnosis not present

## 2014-01-03 DIAGNOSIS — L4050 Arthropathic psoriasis, unspecified: Secondary | ICD-10-CM | POA: Diagnosis not present

## 2014-02-04 ENCOUNTER — Other Ambulatory Visit: Payer: Self-pay | Admitting: Internal Medicine

## 2014-02-04 DIAGNOSIS — M545 Low back pain: Secondary | ICD-10-CM

## 2014-02-04 DIAGNOSIS — L4050 Arthropathic psoriasis, unspecified: Secondary | ICD-10-CM | POA: Diagnosis not present

## 2014-02-04 DIAGNOSIS — M150 Primary generalized (osteo)arthritis: Secondary | ICD-10-CM | POA: Diagnosis not present

## 2014-02-04 DIAGNOSIS — M47817 Spondylosis without myelopathy or radiculopathy, lumbosacral region: Secondary | ICD-10-CM | POA: Diagnosis not present

## 2014-02-12 ENCOUNTER — Ambulatory Visit
Admission: RE | Admit: 2014-02-12 | Discharge: 2014-02-12 | Disposition: A | Discharge status: Home / Self Care | Payer: Medicare Other | Source: Ambulatory Visit | Attending: Internal Medicine | Admitting: Internal Medicine

## 2014-02-12 DIAGNOSIS — M5126 Other intervertebral disc displacement, lumbar region: Secondary | ICD-10-CM | POA: Diagnosis not present

## 2014-02-12 DIAGNOSIS — M47817 Spondylosis without myelopathy or radiculopathy, lumbosacral region: Secondary | ICD-10-CM | POA: Diagnosis not present

## 2014-02-12 DIAGNOSIS — M545 Low back pain: Secondary | ICD-10-CM

## 2014-02-27 DIAGNOSIS — M1990 Unspecified osteoarthritis, unspecified site: Secondary | ICD-10-CM | POA: Diagnosis not present

## 2014-02-27 DIAGNOSIS — E039 Hypothyroidism, unspecified: Secondary | ICD-10-CM | POA: Diagnosis not present

## 2014-02-27 DIAGNOSIS — M549 Dorsalgia, unspecified: Secondary | ICD-10-CM | POA: Diagnosis not present

## 2014-02-27 DIAGNOSIS — I10 Essential (primary) hypertension: Secondary | ICD-10-CM | POA: Diagnosis not present

## 2014-03-04 DIAGNOSIS — M5416 Radiculopathy, lumbar region: Secondary | ICD-10-CM | POA: Diagnosis not present

## 2014-03-04 DIAGNOSIS — L4050 Arthropathic psoriasis, unspecified: Secondary | ICD-10-CM | POA: Diagnosis not present

## 2014-03-04 DIAGNOSIS — M4802 Spinal stenosis, cervical region: Secondary | ICD-10-CM | POA: Diagnosis not present

## 2014-03-04 DIAGNOSIS — M533 Sacrococcygeal disorders, not elsewhere classified: Secondary | ICD-10-CM | POA: Diagnosis not present

## 2014-03-21 DIAGNOSIS — M5416 Radiculopathy, lumbar region: Secondary | ICD-10-CM | POA: Diagnosis not present

## 2014-04-14 DIAGNOSIS — M5416 Radiculopathy, lumbar region: Secondary | ICD-10-CM | POA: Diagnosis not present

## 2014-04-14 DIAGNOSIS — M533 Sacrococcygeal disorders, not elsewhere classified: Secondary | ICD-10-CM | POA: Diagnosis not present

## 2014-04-14 DIAGNOSIS — M129 Arthropathy, unspecified: Secondary | ICD-10-CM | POA: Diagnosis not present

## 2014-04-14 DIAGNOSIS — L4050 Arthropathic psoriasis, unspecified: Secondary | ICD-10-CM | POA: Diagnosis not present

## 2014-04-21 ENCOUNTER — Ambulatory Visit (INDEPENDENT_AMBULATORY_CARE_PROVIDER_SITE_OTHER): Payer: Medicare Other | Admitting: Cardiovascular Disease

## 2014-04-21 ENCOUNTER — Encounter: Payer: Self-pay | Admitting: Cardiovascular Disease

## 2014-04-21 VITALS — BP 156/64 | HR 70 | Resp 16 | Ht 63.0 in | Wt 162.8 lb

## 2014-04-21 DIAGNOSIS — Z720 Tobacco use: Secondary | ICD-10-CM | POA: Diagnosis not present

## 2014-04-21 DIAGNOSIS — I447 Left bundle-branch block, unspecified: Secondary | ICD-10-CM

## 2014-04-21 DIAGNOSIS — R55 Syncope and collapse: Secondary | ICD-10-CM | POA: Diagnosis not present

## 2014-04-21 DIAGNOSIS — E785 Hyperlipidemia, unspecified: Secondary | ICD-10-CM

## 2014-04-21 DIAGNOSIS — I10 Essential (primary) hypertension: Secondary | ICD-10-CM | POA: Diagnosis not present

## 2014-04-21 NOTE — Patient Instructions (Signed)
Dr.Croitoru has ordered for you to have an implantable loop recorder inserted. This will either be done Tuesday afternoon or Thursday.   You will need a Wound check appointment 7-10 days afterwards as well as a follow up appointment with Dr.C in 3 months.

## 2014-04-21 NOTE — Progress Notes (Signed)
Patient ID: [name], female   DOB: [birth year], 57 y.o.   MRN: [id number]     Reason for office visit Near-syncope  [name] is a 59 year old woman with polyarticular inflammatory arthritis related to both rheumatoid and psoriatic disease. She also has a long-standing left bundle branch block which has been documented at least since 2007. She has moderate right subclavian artery stenosis.  In the past she has had occasional episodes of dizziness that seem to be associated with changes in position. Recently the frequency of these events has increased. Sometimes it'll happen several times a day, other times more than a month will pass until they occur. Usually they happen when she is standing, for example cooking but they have also occurred while sitting and even while lying in bed. The typical episode lasts for 30-40 seconds. She has never completely lost consciousness but "sees stars" and feels very dizzy and scared.  She is currently recovering from an upper risk for infection would just cause a little bit of dyspnea. Otherwise this is not a prevalent complaint. She rarely has ankle swelling. This resolves consistently after overnight recumbent position and she never has to adjust her diuretics. She denies chest pain. The dizzy spells are not associated with use of her right upper extremity and she does not have right arm claudication.  She had a normal echocardiogram in 2012. Normal nuclear stress test in 2011. Minimal plaque in the carotids by ultrasonography in 2011 and December 2014. The ultrasound performed in 2014 showed a stable 50-69 percent stenosis in the right subclavian artery. The blood pressure gradient is only 10 mmHg. The peak velocity in the subclavian artery was 325 cm/s (contralateral 243 cm/s).   Allergies  Allergen Reactions  . Adalimumab     Other reaction(s): HEADACHE, NAUSEA, VOMITING  . Arava [Leflunomide] Other (See Comments)    Heart race   . Aspirin Other  (See Comments)    Upset stomach, burning sensation.  . Codeine Nausea And Vomiting and Other (See Comments)    Dizzy,heart speeds up  . Darvocet [Propoxyphene N-Acetaminophen] Other (See Comments)    Heart races  . Fioricet [Butalbital-Apap-Caffeine] Other (See Comments)    Heart races.  . Levofloxacin Nausea And Vomiting  . Sertraline   . Sertraline Hcl Other (See Comments)    Patient states "like a movie going on and can see killing someone else"  . Sulfa Antibiotics Other (See Comments)    Unknown.  Long time ago.    Current Outpatient Prescriptions  Medication Sig Dispense Refill  . albuterol (PROVENTIL HFA;VENTOLIN HFA) 108 (90 BASE) MCG/ACT inhaler Inhale 2 puffs into the lungs every 6 (six) hours as needed for wheezing or shortness of breath.    Marland Kitchen atorvastatin (LIPITOR) 40 MG tablet Take 40 mg by mouth daily.      . celecoxib (CELEBREX) 200 MG capsule Take 200 mg by mouth daily.      . Cholecalciferol (D 1000) 1000 UNITS capsule Take 4 tablets by mouth daily.    Marland Kitchen etanercept (ENBREL) 50 MG/ML injection Inject 50 mg into the skin 2 (two) times a week. Tuesday and Friday.    . gabapentin (NEURONTIN) 600 MG tablet Take 600 mg by mouth daily.     Marland Kitchen levothyroxine (SYNTHROID, LEVOTHROID) 112 MCG tablet Take 112 mcg by mouth daily.      Marland Kitchen LIDODERM 5 % daily as needed.    Marland Kitchen losartan-hydrochlorothiazide (HYZAAR) 100-25 MG per tablet Take 1 tablet by mouth daily.    Marland Kitchen  methadone (DOLOPHINE) 10 MG tablet Take 10 mg by mouth 4 (four) times daily.     Marland Kitchen omega-3 acid ethyl esters (LOVAZA) 1 G capsule Take 3 g by mouth daily.      Marland Kitchen oxyCODONE-acetaminophen (PERCOCET) 10-325 MG per tablet Take 1 tablet by mouth 3 (three) times daily as needed. For pain     . tiZANidine (ZANAFLEX) 4 MG tablet Take 4-8 mg by mouth 4 (four) times daily.      Marland Kitchen ZETIA 10 MG tablet Take 5 mg by mouth daily.      No current facility-administered medications for this visit.    Past Medical History  Diagnosis Date    . Psoriasis   . Chronic back pain   . Hypertension   . Hypercholesteremia   . Osteopenia   . Hypothyroid   . Fibromyalgia   . Addiction, opium   . GERD (gastroesophageal reflux disease)   . Laryngitis   . LBBB (left bundle branch block)     Past Surgical History  Procedure Laterality Date  . Cesarean section    . Appendectomy    . Laparoscopic ovarian cystectomy    . Tonsillectomy    . Cardiac catheterization  06/18/2002    normal  . US echocardiography  08/05/2010    normal  . Nm myocar perf wall motion  07/17/2009    fixed anteroseptal defect,no ischemia    Family History  Problem Relation Age of Onset  . Hypertension Mother   . Heart failure Mother   . Diabetes Mother   . Diabetes Father   . Heart failure Father   . Hypertension Father   . Cancer Brother     brain  . Cancer Brother     lung  . Hypertension Brother   . Hypertension Sister     History   Social History  . Marital Status: Divorced    Spouse Name: N/A    Number of Children: N/A  . Years of Education: N/A   Occupational History  . Not on file.   Social History Main Topics  . Smoking status: Current Every Day Smoker -- 1.00 packs/day  . Smokeless tobacco: Not on file  . Alcohol Use: No  . Drug Use: No  . Sexual Activity: Not on file   Other Topics Concern  . Not on file   Social History Narrative    Review of systems: She has a lot of swelling in stiffness in the joints of both hands, she is in the middle of a flareup.  The patient specifically denies any chest pain at rest or with exertion, dyspnea at rest or with exertion, orthopnea, paroxysmal nocturnal dyspnea, full-blown syncope, palpitations, focal neurological deficits, intermittent claudication, lower extremity edema, unexplained weight gain, cough, hemoptysis or wheezing.  The patient also denies abdominal pain, nausea, vomiting, dysphagia, diarrhea, constipation, polyuria, polydipsia, dysuria, hematuria, frequency, urgency,  abnormal bleeding or bruising, fever, chills, unexpected weight changes, mood swings, change in skin or hair texture, change in voice quality, auditory or visual problems, allergic reactions or rashes, new musculoskeletal complaints other than usual "aches and pains".   PHYSICAL EXAM BP 156/64 mmHg  Pulse 70  Ht 5\' 3"  (1.6 m)  Wt 162 lb 12.8 oz (73.846 kg)  BMI 28.85 kg/m2 General: Alert, oriented x3, no distress Head: no evidence of trauma, PERRL, EOMI, no exophtalmos or lid lag, no myxedema, no xanthelasma; normal ears, nose and oropharynx Neck: normal jugular venous pulsations and no hepatojugular reflux; brisk carotid pulses  without delay and no carotid bruits Chest: clear to auscultation, no signs of consolidation by percussion or palpation, normal fremitus, symmetrical and full respiratory excursions Cardiovascular: normal position and quality of the apical impulse, regular rhythm, normal first and second heart sounds, no murmurs, rubs or gallops Abdomen: no tenderness or distention, no masses by palpation, no abnormal pulsatility or arterial bruits, normal bowel sounds, no hepatosplenomegaly Extremities: no clubbing, cyanosis or edema; she has substantial swelling and deformity in the metacarpophalangeal and interphalangeal joints in both hands; 2+ radial, ulnar and brachial pulses bilaterally; 2+ right femoral, posterior tibial and dorsalis pedis pulses; 2+ left femoral, posterior tibial and dorsalis pedis pulses; no subclavian or femoral bruits Neurological: grossly nonfocal    EKG: Sinus rhythm, left bundle branch block, PR 162 ms, QRS 156 ms, QTC 494 ms  Lipid Panel     Component Value Date/Time   CHOL  08/14/2008 0210    139        ATP III CLASSIFICATION:  <200     mg/dL   Desirable  200-239  mg/dL   Borderline High  >=240    mg/dL   High          TRIG 125 08/14/2008 0210   HDL 28* 08/14/2008 0210   CHOLHDL 5.0 08/14/2008 0210   VLDL 25 08/14/2008 0210   LDLCALC   08/14/2008 0210    86        Total Cholesterol/HDL:CHD Risk Coronary Heart Disease Risk Table                     Men   Women  1/2 Average Risk   3.4   3.3  Average Risk       5.0   4.4  2 X Average Risk   9.6   7.1  3 X Average Risk  23.4   11.0        Use the calculated Patient Ratio above and the CHD Risk Table to determine the patient's CHD Risk.        ATP III CLASSIFICATION (LDL):  <100     mg/dL   Optimal  100-129  mg/dL   Near or Above                    Optimal  130-159  mg/dL   Borderline  160-189  mg/dL   High  >190     mg/dL   Very High    BMET    Component Value Date/Time   NA 138 02/28/2011 1933   K 3.8 02/28/2011 1933   CL 100 02/28/2011 1933   CO2 28 02/28/2011 1933   GLUCOSE 102* 02/28/2011 1933   BUN 8 02/28/2011 1933   CREATININE 0.71 02/28/2011 1933   CALCIUM 9.9 02/28/2011 1933   GFRNONAA >90 02/28/2011 1933   GFRAA >90 02/28/2011 1933     ASSESSMENT AND PLAN  LBBB (left bundle branch block), near syncope Chronic, not associated with cardiomyopathy . She now has symptoms worrisome for intermittent high grade AV block.Currently not on negative dromotropic meds. Discussed need for further arrhythmia monitoring. Compared the pros and cons of a wearable event monitor versus an implantable loop recorder. She is clearly scared about the events that have occurred recently and once a definitive diagnosis. I recommended the implantable device. We discussed the technical details and possible complications. She understands and agrees to proceed.  HTN (hypertension) Mildly elevated today, but usually adequate control. Note adverse effects of celecoxib and  other NSAIDs on BP, but she cannot function without it. We'll continue to monitor but if consistently over 150 will need to add an antihypertensive. She is already on maximum usual dose of Hyzaar. Avoid beta blockers, diltiazem or verapamil. Consider adding amlodipine if necessary.  Hyperlipidemia Last labs I  have available were favorable. She reports Dr. Shelia Media was pleased with her latest lipid profile. Will get report.  Tobacco abuse Discussed need for smoking cessation and ways to achieve this at length.  Orders Placed This Encounter     Orders Placed This Encounter  Procedures  . LOOP RECORDER IMPLANT   Meds ordered this encounter  Medications  . Cholecalciferol (D 1000) 1000 UNITS capsule    Sig: Take 4 tablets by mouth daily.  Marland Kitchen losartan-hydrochlorothiazide (HYZAAR) 100-25 MG per tablet    Sig: Take 1 tablet by mouth daily.  Marland Kitchen albuterol (PROVENTIL HFA;VENTOLIN HFA) 108 (90 BASE) MCG/ACT inhaler    Sig: Inhale 2 puffs into the lungs every 6 (six) hours as needed for wheezing or shortness of breath.    Holli Humbles, MD, [place] [phone] office [phone] pager

## 2014-04-22 ENCOUNTER — Encounter (HOSPITAL_COMMUNITY): Payer: Self-pay | Admitting: Cardiovascular Disease

## 2014-04-22 ENCOUNTER — Encounter (HOSPITAL_COMMUNITY)
Admission: RE | Disposition: A | Discharge status: Home / Self Care | Payer: Self-pay | Source: Ambulatory Visit | Attending: Cardiovascular Disease

## 2014-04-22 ENCOUNTER — Ambulatory Visit (HOSPITAL_COMMUNITY)
Admission: RE | Admit: 2014-04-22 | Discharge: 2014-04-22 | Disposition: A | Discharge status: Home / Self Care | Payer: Medicare Other | Source: Ambulatory Visit | Attending: Cardiovascular Disease | Admitting: Cardiovascular Disease

## 2014-04-22 ENCOUNTER — Telehealth: Payer: Self-pay | Admitting: Cardiology

## 2014-04-22 DIAGNOSIS — R55 Syncope and collapse: Secondary | ICD-10-CM | POA: Insufficient documentation

## 2014-04-22 DIAGNOSIS — M8580 Other specified disorders of bone density and structure, unspecified site: Secondary | ICD-10-CM | POA: Diagnosis not present

## 2014-04-22 DIAGNOSIS — K219 Gastro-esophageal reflux disease without esophagitis: Secondary | ICD-10-CM | POA: Insufficient documentation

## 2014-04-22 DIAGNOSIS — R002 Palpitations: Secondary | ICD-10-CM | POA: Diagnosis not present

## 2014-04-22 DIAGNOSIS — I708 Atherosclerosis of other arteries: Secondary | ICD-10-CM | POA: Diagnosis not present

## 2014-04-22 DIAGNOSIS — G8929 Other chronic pain: Secondary | ICD-10-CM | POA: Diagnosis not present

## 2014-04-22 DIAGNOSIS — E780 Pure hypercholesterolemia: Secondary | ICD-10-CM | POA: Diagnosis not present

## 2014-04-22 DIAGNOSIS — I10 Essential (primary) hypertension: Secondary | ICD-10-CM | POA: Diagnosis not present

## 2014-04-22 DIAGNOSIS — E039 Hypothyroidism, unspecified: Secondary | ICD-10-CM | POA: Diagnosis not present

## 2014-04-22 DIAGNOSIS — I447 Left bundle-branch block, unspecified: Secondary | ICD-10-CM | POA: Insufficient documentation

## 2014-04-22 DIAGNOSIS — M797 Fibromyalgia: Secondary | ICD-10-CM | POA: Diagnosis not present

## 2014-04-22 DIAGNOSIS — M549 Dorsalgia, unspecified: Secondary | ICD-10-CM | POA: Insufficient documentation

## 2014-04-22 DIAGNOSIS — F17210 Nicotine dependence, cigarettes, uncomplicated: Secondary | ICD-10-CM | POA: Diagnosis not present

## 2014-04-22 DIAGNOSIS — M069 Rheumatoid arthritis, unspecified: Secondary | ICD-10-CM | POA: Insufficient documentation

## 2014-04-22 DIAGNOSIS — Z79899 Other long term (current) drug therapy: Secondary | ICD-10-CM | POA: Insufficient documentation

## 2014-04-22 HISTORY — PX: LOOP RECORDER IMPLANT: SHX5477

## 2014-04-22 SURGERY — LOOP RECORDER IMPLANT

## 2014-04-22 MED ORDER — OXYCODONE HCL 5 MG PO TABS
ORAL_TABLET | ORAL | Status: AC
  Administered 2014-04-22: 5 mg via ORAL
  Filled 2014-04-22: qty 1

## 2014-04-22 MED ORDER — OXYCODONE HCL 5 MG PO TABS
5.0000 mg | ORAL_TABLET | ORAL | Status: AC
  Administered 2014-04-22: 5 mg via ORAL

## 2014-04-22 MED ORDER — LIDOCAINE-EPINEPHRINE 1 %-1:100000 IJ SOLN
INTRAMUSCULAR | Status: AC
Start: 2014-04-22 — End: 2014-04-22
  Filled 2014-04-22: qty 1

## 2014-04-22 MED ORDER — OXYCODONE-ACETAMINOPHEN 5-325 MG PO TABS
1.0000 | ORAL_TABLET | ORAL | Status: AC
  Administered 2014-04-22: 1 via ORAL

## 2014-04-22 MED ORDER — OXYCODONE-ACETAMINOPHEN 5-325 MG PO TABS
ORAL_TABLET | ORAL | Status: AC
  Administered 2014-04-22: 1 via ORAL
  Filled 2014-04-22: qty 1

## 2014-04-22 NOTE — Interval H&P Note (Signed)
History and Physical Interval Note:  04/22/2014 2:07 PM  [name]  has presented today for surgery, with the diagnosis of snycope  The various methods of treatment have been discussed with the patient and family. After consideration of risks, benefits and other options for treatment, the patient has consented to  Procedure(s): LOOP RECORDER IMPLANT (N/A) as a surgical intervention .  The patient's history has been reviewed, patient examined, no change in status, stable for surgery.  I have reviewed the patient's chart and labs.  Questions were answered to the patient's satisfaction.     [name]

## 2014-04-22 NOTE — H&P (View-Only) (Signed)
Patient ID: [name], female   DOB: Feb 08, 1956, 59 y.o.   MRN: [id number]     Reason for office visit Near-syncope  [name] is a 59 year old woman with polyarticular inflammatory arthritis related to both rheumatoid and psoriatic disease. She also has a long-standing left bundle branch block which has been documented at least since 2007. She has moderate right subclavian artery stenosis.  In the past she has had occasional episodes of dizziness that seem to be associated with changes in position. Recently the frequency of these events has increased. Sometimes it'll happen several times a day, other times more than a month will pass until they occur. Usually they happen when she is standing, for example cooking but they have also occurred while sitting and even while lying in bed. The typical episode lasts for 30-40 seconds. She has never completely lost consciousness but "sees stars" and feels very dizzy and scared.  She is currently recovering from an upper risk for infection would just cause a little bit of dyspnea. Otherwise this is not a prevalent complaint. She rarely has ankle swelling. This resolves consistently after overnight recumbent position and she never has to adjust her diuretics. She denies chest pain. The dizzy spells are not associated with use of her right upper extremity and she does not have right arm claudication.  She had a normal echocardiogram in 2012. Normal nuclear stress test in 2011. Minimal plaque in the carotids by ultrasonography in 2011 and December 2014. The ultrasound performed in 2014 showed a stable 50-69 percent stenosis in the right subclavian artery. The blood pressure gradient is only 10 mmHg. The peak velocity in the subclavian artery was 325 cm/s (contralateral 243 cm/s).   Allergies  Allergen Reactions  . Adalimumab     Other reaction(s): HEADACHE, NAUSEA, VOMITING  . Arava [Leflunomide] Other (See Comments)    Heart race   . Aspirin Other  (See Comments)    Upset stomach, burning sensation.  . Codeine Nausea And Vomiting and Other (See Comments)    Dizzy,heart speeds up  . Darvocet [Propoxyphene N-Acetaminophen] Other (See Comments)    Heart races  . Fioricet [Butalbital-Apap-Caffeine] Other (See Comments)    Heart races.  . Levofloxacin Nausea And Vomiting  . Sertraline   . Sertraline Hcl Other (See Comments)    Patient states "like a movie going on and can see killing someone else"  . Sulfa Antibiotics Other (See Comments)    Unknown.  Long time ago.    Current Outpatient Prescriptions  Medication Sig Dispense Refill  . albuterol (PROVENTIL HFA;VENTOLIN HFA) 108 (90 BASE) MCG/ACT inhaler Inhale 2 puffs into the lungs every 6 (six) hours as needed for wheezing or shortness of breath.    Marland Kitchen atorvastatin (LIPITOR) 40 MG tablet Take 40 mg by mouth daily.      . celecoxib (CELEBREX) 200 MG capsule Take 200 mg by mouth daily.      . Cholecalciferol (D 1000) 1000 UNITS capsule Take 4 tablets by mouth daily.    Marland Kitchen etanercept (ENBREL) 50 MG/ML injection Inject 50 mg into the skin 2 (two) times a week. Tuesday and Friday.    . gabapentin (NEURONTIN) 600 MG tablet Take 600 mg by mouth daily.     Marland Kitchen levothyroxine (SYNTHROID, LEVOTHROID) 112 MCG tablet Take 112 mcg by mouth daily.      Marland Kitchen LIDODERM 5 % daily as needed.    Marland Kitchen losartan-hydrochlorothiazide (HYZAAR) 100-25 MG per tablet Take 1 tablet by mouth daily.    Marland Kitchen  methadone (DOLOPHINE) 10 MG tablet Take 10 mg by mouth 4 (four) times daily.     Marland Kitchen omega-3 acid ethyl esters (LOVAZA) 1 G capsule Take 3 g by mouth daily.      Marland Kitchen oxyCODONE-acetaminophen (PERCOCET) 10-325 MG per tablet Take 1 tablet by mouth 3 (three) times daily as needed. For pain     . tiZANidine (ZANAFLEX) 4 MG tablet Take 4-8 mg by mouth 4 (four) times daily.      Marland Kitchen ZETIA 10 MG tablet Take 5 mg by mouth daily.      No current facility-administered medications for this visit.    Past Medical History  Diagnosis Date    . Psoriasis   . Chronic back pain   . Hypertension   . Hypercholesteremia   . Osteopenia   . Hypothyroid   . Fibromyalgia   . Addiction, opium   . GERD (gastroesophageal reflux disease)   . Laryngitis   . LBBB (left bundle branch block)     Past Surgical History  Procedure Laterality Date  . Cesarean section    . Appendectomy    . Laparoscopic ovarian cystectomy    . Tonsillectomy    . Cardiac catheterization  06/18/2002    normal  . US echocardiography  08/05/2010    normal  . Nm myocar perf wall motion  07/17/2009    fixed anteroseptal defect,no ischemia    Family History  Problem Relation Age of Onset  . Hypertension Mother   . Heart failure Mother   . Diabetes Mother   . Diabetes Father   . Heart failure Father   . Hypertension Father   . Cancer Brother     brain  . Cancer Brother     lung  . Hypertension Brother   . Hypertension Sister     History   Social History  . Marital Status: Divorced    Spouse Name: N/A    Number of Children: N/A  . Years of Education: N/A   Occupational History  . Not on file.   Social History Main Topics  . Smoking status: Current Every Day Smoker -- 1.00 packs/day  . Smokeless tobacco: Not on file  . Alcohol Use: No  . Drug Use: No  . Sexual Activity: Not on file   Other Topics Concern  . Not on file   Social History Narrative    Review of systems: She has a lot of swelling in stiffness in the joints of both hands, she is in the middle of a flareup.  The patient specifically denies any chest pain at rest or with exertion, dyspnea at rest or with exertion, orthopnea, paroxysmal nocturnal dyspnea, full-blown syncope, palpitations, focal neurological deficits, intermittent claudication, lower extremity edema, unexplained weight gain, cough, hemoptysis or wheezing.  The patient also denies abdominal pain, nausea, vomiting, dysphagia, diarrhea, constipation, polyuria, polydipsia, dysuria, hematuria, frequency, urgency,  abnormal bleeding or bruising, fever, chills, unexpected weight changes, mood swings, change in skin or hair texture, change in voice quality, auditory or visual problems, allergic reactions or rashes, new musculoskeletal complaints other than usual "aches and pains".   PHYSICAL EXAM BP 156/64 mmHg  Pulse 70  Ht 5\' 3"  (1.6 m)  Wt 162 lb 12.8 oz (73.846 kg)  BMI 28.85 kg/m2 General: Alert, oriented x3, no distress Head: no evidence of trauma, PERRL, EOMI, no exophtalmos or lid lag, no myxedema, no xanthelasma; normal ears, nose and oropharynx Neck: normal jugular venous pulsations and no hepatojugular reflux; brisk carotid pulses  without delay and no carotid bruits Chest: clear to auscultation, no signs of consolidation by percussion or palpation, normal fremitus, symmetrical and full respiratory excursions Cardiovascular: normal position and quality of the apical impulse, regular rhythm, normal first and second heart sounds, no murmurs, rubs or gallops Abdomen: no tenderness or distention, no masses by palpation, no abnormal pulsatility or arterial bruits, normal bowel sounds, no hepatosplenomegaly Extremities: no clubbing, cyanosis or edema; she has substantial swelling and deformity in the metacarpophalangeal and interphalangeal joints in both hands; 2+ radial, ulnar and brachial pulses bilaterally; 2+ right femoral, posterior tibial and dorsalis pedis pulses; 2+ left femoral, posterior tibial and dorsalis pedis pulses; no subclavian or femoral bruits Neurological: grossly nonfocal    EKG: Sinus rhythm, left bundle branch block, PR 162 ms, QRS 156 ms, QTC 494 ms  Lipid Panel     Component Value Date/Time   CHOL  08/14/2008 0210    139        ATP III CLASSIFICATION:  <200     mg/dL   Desirable  200-239  mg/dL   Borderline High  >=240    mg/dL   High          TRIG 125 08/14/2008 0210   HDL 28* 08/14/2008 0210   CHOLHDL 5.0 08/14/2008 0210   VLDL 25 08/14/2008 0210   LDLCALC   08/14/2008 0210    86        Total Cholesterol/HDL:CHD Risk Coronary Heart Disease Risk Table                     Men   Women  1/2 Average Risk   3.4   3.3  Average Risk       5.0   4.4  2 X Average Risk   9.6   7.1  3 X Average Risk  23.4   11.0        Use the calculated Patient Ratio above and the CHD Risk Table to determine the patient's CHD Risk.        ATP III CLASSIFICATION (LDL):  <100     mg/dL   Optimal  100-129  mg/dL   Near or Above                    Optimal  130-159  mg/dL   Borderline  160-189  mg/dL   High  >190     mg/dL   Very High    BMET    Component Value Date/Time   NA 138 02/28/2011 1933   K 3.8 02/28/2011 1933   CL 100 02/28/2011 1933   CO2 28 02/28/2011 1933   GLUCOSE 102* 02/28/2011 1933   BUN 8 02/28/2011 1933   CREATININE 0.71 02/28/2011 1933   CALCIUM 9.9 02/28/2011 1933   GFRNONAA >90 02/28/2011 1933   GFRAA >90 02/28/2011 1933     ASSESSMENT AND PLAN  LBBB (left bundle branch block), near syncope Chronic, not associated with cardiomyopathy . She now has symptoms worrisome for intermittent high grade AV block.Currently not on negative dromotropic meds. Discussed need for further arrhythmia monitoring. Compared the pros and cons of a wearable event monitor versus an implantable loop recorder. She is clearly scared about the events that have occurred recently and once a definitive diagnosis. I recommended the implantable device. We discussed the technical details and possible complications. She understands and agrees to proceed.  HTN (hypertension) Mildly elevated today, but usually adequate control. Note adverse effects of celecoxib and  other NSAIDs on BP, but she cannot function without it. We'll continue to monitor but if consistently over 150 will need to add an antihypertensive. She is already on maximum usual dose of Hyzaar. Avoid beta blockers, diltiazem or verapamil. Consider adding amlodipine if necessary.  Hyperlipidemia Last labs I  have available were favorable. She reports Dr. Shelia Media was pleased with her latest lipid profile. Will get report.  Tobacco abuse Discussed need for smoking cessation and ways to achieve this at length.  Orders Placed This Encounter     Orders Placed This Encounter  Procedures  . LOOP RECORDER IMPLANT   Meds ordered this encounter  Medications  . Cholecalciferol (D 1000) 1000 UNITS capsule    Sig: Take 4 tablets by mouth daily.  Marland Kitchen losartan-hydrochlorothiazide (HYZAAR) 100-25 MG per tablet    Sig: Take 1 tablet by mouth daily.  Marland Kitchen albuterol (PROVENTIL HFA;VENTOLIN HFA) 108 (90 BASE) MCG/ACT inhaler    Sig: Inhale 2 puffs into the lungs every 6 (six) hours as needed for wheezing or shortness of breath.    Holli Humbles, MD, [place] [phone] office [phone] pager

## 2014-04-22 NOTE — Discharge Instructions (Signed)
Supplemental Discharge Instructions for  Pacemaker/Defibrillator Patients  Activity No restrictions  WOUND CARE - Keep the wound area clean and dry.  Remove the dressing the day after you return home (usually 48 hours after the procedure). - DO NOT SUBMERGE UNDER WATER UNTIL FULLY HEALED (no tub baths, hot tubs, swimming pools, etc.).  - You  may shower or take a sponge bath after the dressing is removed. DO NOT SOAK the area and do not allow the shower to directly spray on the site. - If you have staples, these will be removed in the office in 7-14 days. - If you have tape/steri-strips on your wound, these will fall off; do not pull them off prematurely.   - No bandage is needed on the site.  DO  NOT apply any creams, oils, or ointments to the wound area. - If you notice any drainage or discharge from the wound, any swelling, excessive redness or bruising at the site, or if you develop a fever > 101? F after you are discharged home, call the office at once.  Special Instructions - You are still able to use cellular telephones.  Avoid carrying your cellular phone near your device. - When traveling through airports, show security personnel your identification card to avoid being screened in the metal detectors.  - Avoid arc welding equipment, TENS units (transcutaneous nerve stimulators). MRI scans can be performed, but certain precautions need to be taken. Call the office for questions about other devices. - Avoid electrical appliances that are in poor condition or are not properly grounded. - Microwave ovens are safe to be near or to operate.

## 2014-04-22 NOTE — Op Note (Signed)
LOOP RECORDER IMPLANT   Procedure report  Procedure performed:  1. Loop recorder implantation  Reason for procedure:  1. Recurrent syncope/near-syncope; palpitations  Procedure performed by:  Sanda Klein, MD  Complications:  None  Estimated blood loss:  <5 mL  Medications administered during procedure:  1% lidocaine locally  Device details:  Medtronic Reveal Linq model number [id number], serial number [id number] Procedure details:  After the risks and benefits of the procedure were discussed the patient provided informed consent. He was brought to the cardiac catheter lab in the fasting state. The patient was prepped and draped in usual sterile fashion. Local anesthesia with 1% lidocaine was administered to an area 2 cm to the left of the sternum in the 4th intercostal space. A horizontal incision was made using the incision tool. The introducer was then used to create a subcutaneous tunnel and carefully deploy the device. Local pressure was held to ensure hemostasis.  The incision was closed with SteriStrips and a sterile dressing was applied.  R waves 0.27 mV Sanda Klein, MD, [facility] [phone] office [phone] pager 04/22/2014 4:45 PM

## 2014-04-22 NOTE — Telephone Encounter (Signed)
Pt underwent ILR implant today and calling as the bandage is slightly bloody and she was told to call if any blood any all. She reports that she had some bleeding post procedure and they had to place an oversized bandage over implant side. Recommended placing steady and consistent pressure for 20 mins. If continues to ooze, bleed onto clothing, recommended she come to ER. If able to make it until morning, she is to come to the office for bandage replacment. Nursing, please call in the AM to check in. FYI to Dr. Recardo Evangelist.

## 2014-04-23 ENCOUNTER — Ambulatory Visit (INDEPENDENT_AMBULATORY_CARE_PROVIDER_SITE_OTHER): Payer: Medicare Other | Admitting: Cardiovascular Disease

## 2014-04-23 ENCOUNTER — Encounter: Payer: Self-pay | Admitting: Cardiovascular Disease

## 2014-04-23 ENCOUNTER — Telehealth: Payer: Self-pay | Admitting: Cardiovascular Disease

## 2014-04-23 VITALS — BP 122/66 | HR 64 | Ht 63.0 in | Wt 163.0 lb

## 2014-04-23 DIAGNOSIS — Z95818 Presence of other cardiac implants and grafts: Secondary | ICD-10-CM | POA: Insufficient documentation

## 2014-04-23 NOTE — Telephone Encounter (Signed)
Pt called in stating that she had a loop recorder put in yesterday and now she is having some bleed. She would like to be advised on what to do. Please call  thanks

## 2014-04-23 NOTE — Addendum Note (Signed)
Addended by: Vear Clock on: 04/23/2014 07:56 AM   Modules accepted: Orders

## 2014-04-23 NOTE — Progress Notes (Signed)
Patient ID: [name], female   DOB: [birth year], 57 y.o.   MRN: [id number]  Small amount of oozing on surgical dressing and small ecchymosis caudal to ILR. No active bleeding or sign of infection. New dressing applied, but the initial steri-strips were intact and left in place. Keep wound check appt. MCr

## 2014-04-23 NOTE — Telephone Encounter (Signed)
Returned call to patient she stated she had a loop recorder implant yesterday.Stated has noticed bright red bleeding.Stated she called Dr.on call last night and was told to apply pressure.Stated continues to bleed this morning.Spoke to Dr.Croitoru he advised he will check site.Appointment scheduled with Dr.Croitoru this afternoon at 4:00 pm.

## 2014-04-23 NOTE — Patient Instructions (Signed)
Keep appointment for 7 -10 days after Loop Recorder implant.

## 2014-04-30 DIAGNOSIS — M5416 Radiculopathy, lumbar region: Secondary | ICD-10-CM | POA: Diagnosis not present

## 2014-05-05 ENCOUNTER — Encounter: Payer: Medicare Other | Admitting: Cardiovascular Disease

## 2014-05-06 ENCOUNTER — Telehealth: Payer: Self-pay | Admitting: Cardiovascular Disease

## 2014-05-06 NOTE — Telephone Encounter (Signed)
Pt states that she experienced near syncope.Transmission received. ECG shows SR w/oversensing. I encouraged pt to call EMS if this happens again. Patient voiced understanding.

## 2014-05-06 NOTE — Telephone Encounter (Signed)
SPOKE TO PATIENT SHE STATES SHE HAD AN EPISODE ABOUT 35 -40 MINUTES . SHE WS TOLD TO CONTACT THE OFFICE TO SEE IF TRANSMISSION CAME THROUGH. WILL DEFER TO TO DEVICE CLINIC- PLEASE HAVE SOMEONE CALL HER

## 2014-05-06 NOTE — Telephone Encounter (Signed)
Please call asap,had episode while she was wearing the loop recorder.

## 2014-05-08 ENCOUNTER — Encounter: Payer: Self-pay | Admitting: Cardiovascular Disease

## 2014-05-08 ENCOUNTER — Ambulatory Visit (INDEPENDENT_AMBULATORY_CARE_PROVIDER_SITE_OTHER): Payer: Medicare Other | Admitting: Cardiovascular Disease

## 2014-05-08 VITALS — BP 120/70 | HR 66 | Resp 16 | Ht 63.0 in | Wt 165.1 lb

## 2014-05-08 DIAGNOSIS — I447 Left bundle-branch block, unspecified: Secondary | ICD-10-CM

## 2014-05-08 DIAGNOSIS — E785 Hyperlipidemia, unspecified: Secondary | ICD-10-CM

## 2014-05-08 DIAGNOSIS — Z95818 Presence of other cardiac implants and grafts: Secondary | ICD-10-CM

## 2014-05-08 DIAGNOSIS — R55 Syncope and collapse: Secondary | ICD-10-CM

## 2014-05-08 DIAGNOSIS — I10 Essential (primary) hypertension: Secondary | ICD-10-CM

## 2014-05-08 NOTE — Patient Instructions (Signed)
Dr. Sallyanne Kuster recommends that you schedule a follow-up appointment in: 2 months.

## 2014-05-09 NOTE — Progress Notes (Signed)
Patient ID: [name], female   DOB: [birth year], 59 y.o.   MRN: [id number]      Reason for office visit Near syncope  [name] had a near syncopal event last week, this time preceded by visual blurring and flushing, more suggestive of a vasovagal event. Her loop recorder, recently implanted showed normal rhythm at the time. There was suspicion that her syncope might be arrhythmic since she also has a left bundle branch block and some of the events have had very little if any prodrome. Her loop recorder site is healing quickly and there is no sign of infection.  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 She also has a long-standing left bundle branch block which has been documented at least since 2007. She has moderate right subclavian artery stenosis. She had a normal echocardiogram in 2012. Normal nuclear stress test in 2011. Minimal plaque in the carotids by ultrasonography in 2011 and December 2014. The ultrasound performed in 2014 showed a stable 50-69 percent stenosis in the right subclavian artery. The blood pressure gradient is only 10 mmHg. The peak velocity in the subclavian artery was 325 cm/s (contralateral 243 cm/s).  Allergies  Allergen Reactions  . Adalimumab Nausea And Vomiting and Other (See Comments)    Headache,   . Arava [Leflunomide] Other (See Comments)    Heart races   . Aspirin Other (See Comments)    Upset stomach, burning sensation.  . Codeine Nausea And Vomiting and Other (See Comments)    Dizzy,heart speeds up  . Darvocet [Propoxyphene N-Acetaminophen] Other (See Comments)    Heart races  . Fioricet [Butalbital-Apap-Caffeine] Other (See Comments)    Heart races.  . Levofloxacin Nausea And Vomiting  . Sertraline Hcl Other (See Comments)    Patient states "like a movie going on and can see killing someone else"  . Sulfa Antibiotics Diarrhea and Other (See Comments)   Stomach upset    Current Outpatient Prescriptions  Medication Sig Dispense Refill  . albuterol (PROAIR HFA) 108 (90 BASE) MCG/ACT inhaler Inhale 2 puffs into the lungs every 6 (six) hours as needed for wheezing or shortness of breath.    Marland Kitchen atorvastatin (LIPITOR) 40 MG tablet Take 40 mg by mouth at bedtime.     . celecoxib (CELEBREX) 200 MG capsule Take 200 mg by mouth daily.      . cetirizine (ZYRTEC) 10 MG tablet Take 10 mg by mouth daily as needed for allergies.   11  . Cholecalciferol (VITAMIN D) 2000 UNITS tablet Take 2,000 Units by mouth 2 (two) times daily.    . Cinnamon 500 MG capsule Take 500 mg by mouth 2 (two) times daily.    . diclofenac sodium (VOLTAREN) 1 % GEL Apply 1 application topically 3 (three) times daily as needed (pain). On hands and knees    . etanercept (ENBREL) 50 MG/ML injection Inject 50 mg into the skin 2 (two) times a week. Tuesday and Friday.    . ezetimibe (ZETIA) 10 MG tablet Take 5 mg by mouth daily.    Marland Kitchen gabapentin (NEURONTIN) 600 MG tablet Take 300 mg by mouth 2 (two) times daily.     Marland Kitchen levothyroxine (SYNTHROID, LEVOTHROID) 112 MCG tablet Take 112 mcg by mouth daily before breakfast.     . lidocaine (LIDODERM) 5 % Place 1 patch onto the skin daily. Remove & Discard patch within 12 hours or as directed by MD    .  losartan-hydrochlorothiazide (HYZAAR) 100-25 MG per tablet Take 1 tablet by mouth daily.    . methadone (DOLOPHINE) 10 MG tablet Take 10 mg by mouth 4 (four) times daily.     . Omega-3 Fatty Acids (FISH OIL) 1000 MG CAPS Take 1,000 mg by mouth 2 (two) times daily.    Marland Kitchen oxyCODONE-acetaminophen (ENDOCET) 10-325 MG per tablet Take 1 tablet by mouth 3 (three) times daily as needed for pain.    . polyvinyl alcohol (LIQUIFILM TEARS) 1.4 % ophthalmic solution Place 1 drop into both eyes as needed for dry eyes.    Marland Kitchen POTASSIUM PO Take 1 tablet by mouth 2 (two) times daily. 595 mg    . tiZANidine (ZANAFLEX) 4 MG tablet Take 4 mg by mouth 4 (four) times daily.       No current facility-administered medications for this visit.    Past Medical History  Diagnosis Date  . Psoriasis   . Chronic back pain   . Hypertension   . Hypercholesteremia   . Osteopenia   . Hypothyroid   . Fibromyalgia   . Addiction, opium   . GERD (gastroesophageal reflux disease)   . Laryngitis   . LBBB (left bundle branch block)     Past Surgical History  Procedure Laterality Date  . Cesarean section    . Appendectomy    . Laparoscopic ovarian cystectomy    . Tonsillectomy    . Cardiac catheterization  06/18/2002    normal  . US echocardiography  08/05/2010    normal  . Nm myocar perf wall motion  07/17/2009    fixed anteroseptal defect,no ischemia  . Loop recorder implant N/A 04/22/2014    Procedure: LOOP RECORDER IMPLANT;  Surgeon: Sanda Klein, MD;  Location: [facility];  Service: Cardiovascular;  Laterality: N/A;    Family History  Problem Relation Age of Onset  . Hypertension Mother   . Heart failure Mother   . Diabetes Mother   . Diabetes Father   . Heart failure Father   . Hypertension Father   . Cancer Brother     brain  . Cancer Brother     lung  . Hypertension Brother   . Hypertension Sister     History   Social History  . Marital Status: Divorced    Spouse Name: N/A    Number of Children: N/A  . Years of Education: N/A   Occupational History  . Not on file.   Social History Main Topics  . Smoking status: Current Every Day Smoker -- 1.00 packs/day  . Smokeless tobacco: Not on file  . Alcohol Use: No  . Drug Use: No  . Sexual Activity: Not on file   Other Topics Concern  . Not on file   Social History Narrative    Review of systems: The patient specifically denies any chest pain at rest or with exertion, dyspnea at rest or with exertion, orthopnea, paroxysmal nocturnal dyspnea, full-blown syncope, palpitations, focal neurological deficits, intermittent claudication, lower extremity edema, unexplained weight gain, cough,  hemoptysis or wheezing.  The patient also denies abdominal pain, nausea, vomiting, dysphagia, diarrhea, constipation, polyuria, polydipsia, dysuria, hematuria, frequency, urgency, abnormal bleeding or bruising, fever, chills, unexpected weight changes, mood swings, change in skin or hair texture, change in voice quality, auditory or visual problems, allergic reactions or rashes, new musculoskeletal complaints other than usual "aches and pains".  PHYSICAL EXAM BP 120/70 mmHg  Pulse 66  Resp 16  Ht 5\' 3"  (1.6 m)  Wt 165  lb 1.6 oz (74.889 kg)  BMI 29.25 kg/m2 General: Alert, oriented x3, no distress Head: no evidence of trauma, PERRL, EOMI, no exophtalmos or lid lag, no myxedema, no xanthelasma; normal ears, nose and oropharynx Neck: normal jugular venous pulsations and no hepatojugular reflux; brisk carotid pulses without delay and no carotid bruits Chest: clear to auscultation, no signs of consolidation by percussion or palpation, normal fremitus, symmetrical and full respiratory excursions Cardiovascular: normal position and quality of the apical impulse, regular rhythm, normal first and second heart sounds, no murmurs, rubs or gallops Abdomen: no tenderness or distention, no masses by palpation, no abnormal pulsatility or arterial bruits, normal bowel sounds, no hepatosplenomegaly Extremities: no clubbing, cyanosis or edema; she has substantial swelling and deformity in the metacarpophalangeal and interphalangeal joints in both hands; 2+ radial, ulnar and brachial pulses bilaterally; 2+ right femoral, posterior tibial and dorsalis pedis pulses; 2+ left femoral, posterior tibial and dorsalis pedis pulses; no subclavian or femoral bruits Neurological: grossly nonfocal    EKG: Sinus rhythm, left bundle branch block  Lipid Panel     Component Value Date/Time   CHOL  08/14/2008 0210    139        ATP III CLASSIFICATION:  <200     mg/dL   Desirable  200-239  mg/dL   Borderline High  >=240     mg/dL   High          TRIG 125 08/14/2008 0210   HDL 28* 08/14/2008 0210   CHOLHDL 5.0 08/14/2008 0210   VLDL 25 08/14/2008 0210   LDLCALC  08/14/2008 0210    86        Total Cholesterol/HDL:CHD Risk Coronary Heart Disease Risk Table                     Men   Women  1/2 Average Risk   3.4   3.3  Average Risk       5.0   4.4  2 X Average Risk   9.6   7.1  3 X Average Risk  23.4   11.0        Use the calculated Patient Ratio above and the CHD Risk Table to determine the patient's CHD Risk.        ATP III CLASSIFICATION (LDL):  <100     mg/dL   Optimal  100-129  mg/dL   Near or Above                    Optimal  130-159  mg/dL   Borderline  160-189  mg/dL   High  >190     mg/dL   Very High    BMET    Component Value Date/Time   NA 138 02/28/2011 1933   K 3.8 02/28/2011 1933   CL 100 02/28/2011 1933   CO2 28 02/28/2011 1933   GLUCOSE 102* 02/28/2011 1933   BUN 8 02/28/2011 1933   CREATININE 0.71 02/28/2011 1933   CALCIUM 9.9 02/28/2011 1933   GFRNONAA >90 02/28/2011 1933   GFRAA >90 02/28/2011 1933     ASSESSMENT AND PLAN LBBB (left bundle branch block), near syncope Chronic, not associated with cardiomyopathy . So far no documentation of high-grade AV block  HTN (hypertension) Excellent BP today. Avoid beta blockers, diltiazem or verapamil.  Hyperlipidemia Last labs were favorable.  Tobacco abuse Discussed need for smoking cessation again.   Orders Placed This Encounter  Procedures  . EKG 12-Lead   No  orders of the defined types were placed in this encounter.    Holli Humbles, MD, [facility] [phone] office [phone] pager

## 2014-05-13 DIAGNOSIS — E876 Hypokalemia: Secondary | ICD-10-CM | POA: Diagnosis not present

## 2014-05-13 DIAGNOSIS — H9319 Tinnitus, unspecified ear: Secondary | ICD-10-CM | POA: Diagnosis not present

## 2014-05-13 DIAGNOSIS — R5383 Other fatigue: Secondary | ICD-10-CM | POA: Diagnosis not present

## 2014-05-22 ENCOUNTER — Ambulatory Visit (INDEPENDENT_AMBULATORY_CARE_PROVIDER_SITE_OTHER): Payer: Medicare Other | Admitting: *Deleted

## 2014-05-22 DIAGNOSIS — R55 Syncope and collapse: Secondary | ICD-10-CM | POA: Diagnosis not present

## 2014-05-28 DIAGNOSIS — M5416 Radiculopathy, lumbar region: Secondary | ICD-10-CM | POA: Diagnosis not present

## 2014-05-28 DIAGNOSIS — M129 Arthropathy, unspecified: Secondary | ICD-10-CM | POA: Diagnosis not present

## 2014-05-28 DIAGNOSIS — L4050 Arthropathic psoriasis, unspecified: Secondary | ICD-10-CM | POA: Diagnosis not present

## 2014-05-28 DIAGNOSIS — M533 Sacrococcygeal disorders, not elsewhere classified: Secondary | ICD-10-CM | POA: Diagnosis not present

## 2014-05-28 NOTE — Progress Notes (Signed)
Loop recorder 

## 2014-05-31 DIAGNOSIS — R51 Headache: Secondary | ICD-10-CM | POA: Diagnosis not present

## 2014-06-03 DIAGNOSIS — M4802 Spinal stenosis, cervical region: Secondary | ICD-10-CM | POA: Diagnosis not present

## 2014-06-03 DIAGNOSIS — R51 Headache: Secondary | ICD-10-CM | POA: Diagnosis not present

## 2014-06-03 DIAGNOSIS — H40003 Preglaucoma, unspecified, bilateral: Secondary | ICD-10-CM | POA: Diagnosis not present

## 2014-06-03 LAB — MDC_IDC_ENUM_SESS_TYPE_REMOTE
Date Time Interrogation Session: 20160126221353
MDC IDC SET ZONE DETECTION INTERVAL: 3000 ms
Zone Setting Detection Interval: 2000 ms
Zone Setting Detection Interval: 340 ms

## 2014-06-20 ENCOUNTER — Encounter: Payer: Medicare Other | Admitting: *Deleted

## 2014-06-20 ENCOUNTER — Ambulatory Visit (INDEPENDENT_AMBULATORY_CARE_PROVIDER_SITE_OTHER): Payer: Medicare Other | Admitting: *Deleted

## 2014-06-20 ENCOUNTER — Encounter: Payer: Self-pay | Admitting: Cardiovascular Disease

## 2014-06-20 DIAGNOSIS — R55 Syncope and collapse: Secondary | ICD-10-CM | POA: Diagnosis not present

## 2014-06-20 LAB — MDC_IDC_ENUM_SESS_TYPE_REMOTE

## 2014-06-26 NOTE — Progress Notes (Signed)
Loop recorder 

## 2014-07-07 ENCOUNTER — Encounter: Payer: Self-pay | Admitting: Cardiovascular Disease

## 2014-07-07 ENCOUNTER — Ambulatory Visit (INDEPENDENT_AMBULATORY_CARE_PROVIDER_SITE_OTHER): Payer: Medicare Other | Admitting: Cardiovascular Disease

## 2014-07-07 VITALS — BP 143/75 | HR 67 | Ht 63.5 in | Wt 168.0 lb

## 2014-07-07 DIAGNOSIS — I10 Essential (primary) hypertension: Secondary | ICD-10-CM

## 2014-07-07 DIAGNOSIS — R55 Syncope and collapse: Secondary | ICD-10-CM | POA: Diagnosis not present

## 2014-07-07 DIAGNOSIS — Z95818 Presence of other cardiac implants and grafts: Secondary | ICD-10-CM

## 2014-07-07 DIAGNOSIS — I447 Left bundle-branch block, unspecified: Secondary | ICD-10-CM | POA: Diagnosis not present

## 2014-07-07 NOTE — Progress Notes (Signed)
Patient ID: [name], female   DOB: 1955/09/27, 59 y.o.   MRN: [id number]     Cardiology Office Note   Date:  07/07/2014   ID:  [name], DOB 15-Oct-1955, MRN [id number]  PCP:  Horatio Pel, MD  Cardiologist:   Sanda Klein, MD   Chief Complaint  Patient presents with  . ROV 2 months    patient reports shortness of breath occas at rest and swelling in her stomach      History of Present Illness: [name] is a 59 y.o. female who presents for follow-up of presyncope in the setting of pre-existing left bundle branch block, but with a clinical syndrome more suggestive of neurally mediated syncope rather than arrhythmic syncope.  Her loop recorder site has healed well. She has not had any new clinical episodes of syncope or near syncope. The device has not recorded any abnormalities in her rhythm.  Mrs. 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 She also has a long-standing left bundle branch block which has been documented at least since 2007. She has moderate right subclavian artery stenosis. She had a normal echocardiogram in 2012. Normal nuclear stress test in 2011. Minimal plaque in the carotids by ultrasonography in 2011 and December 2014. The ultrasound performed in 2014 showed a stable 50-69 percent stenosis in the right subclavian artery. The blood pressure gradient is only 10 mmHg. The peak velocity in the subclavian artery was 325 cm/s (contralateral 243 cm/s).  Past Medical History  Diagnosis Date  . Psoriasis   . Chronic back pain   . Hypertension   . Hypercholesteremia   . Osteopenia   . Hypothyroid   . Fibromyalgia   . Addiction, opium   . GERD (gastroesophageal reflux disease)   . Laryngitis   . LBBB (left bundle branch block)     Past Surgical History  Procedure Laterality Date  . Cesarean section    . Appendectomy    . Laparoscopic ovarian cystectomy    .  Tonsillectomy    . Cardiac catheterization  06/18/2002    normal  . US echocardiography  08/05/2010    normal  . Nm myocar perf wall motion  07/17/2009    fixed anteroseptal defect,no ischemia  . Loop recorder implant N/A 04/22/2014    Procedure: LOOP RECORDER IMPLANT;  Surgeon: Sanda Klein, MD;  Location: [facility];  Service: Cardiovascular;  Laterality: N/A;     Current Outpatient Prescriptions  Medication Sig Dispense Refill  . albuterol (PROAIR HFA) 108 (90 BASE) MCG/ACT inhaler Inhale 2 puffs into the lungs every 6 (six) hours as needed for wheezing or shortness of breath.    Marland Kitchen atorvastatin (LIPITOR) 40 MG tablet Take 40 mg by mouth at bedtime.     . celecoxib (CELEBREX) 200 MG capsule Take 200 mg by mouth daily.      . cetirizine (ZYRTEC) 10 MG tablet Take 10 mg by mouth daily as needed for allergies.   11  . Cholecalciferol (VITAMIN D) 2000 UNITS tablet Take 2,000 Units by mouth 2 (two) times daily.    . Cinnamon 500 MG capsule Take 500 mg by mouth 2 (two) times daily.    . diclofenac sodium (VOLTAREN) 1 % GEL Apply 1 application topically 3 (three) times daily as needed (pain). On hands and knees    . etanercept (ENBREL) 50 MG/ML injection Inject 50 mg into the skin 2 (two) times a week. Tuesday and  Friday.    . ezetimibe (ZETIA) 10 MG tablet Take 5 mg by mouth daily.    Marland Kitchen gabapentin (NEURONTIN) 600 MG tablet Take 300 mg by mouth 2 (two) times daily.     Marland Kitchen levothyroxine (SYNTHROID, LEVOTHROID) 112 MCG tablet Take 112 mcg by mouth daily before breakfast.     . lidocaine (LIDODERM) 5 % Place 1 patch onto the skin daily. Remove & Discard patch within 12 hours or as directed by MD    . losartan-hydrochlorothiazide (HYZAAR) 100-25 MG per tablet Take 1 tablet by mouth daily.    . methadone (DOLOPHINE) 10 MG tablet Take 10 mg by mouth 4 (four) times daily.     . Omega-3 Fatty Acids (FISH OIL) 1000 MG CAPS Take 1,000 mg by mouth 2 (two) times daily.    Marland Kitchen oxyCODONE-acetaminophen (ENDOCET)  10-325 MG per tablet Take 1 tablet by mouth 3 (three) times daily as needed for pain.    . polyvinyl alcohol (LIQUIFILM TEARS) 1.4 % ophthalmic solution Place 1 drop into both eyes as needed for dry eyes.    Marland Kitchen POTASSIUM PO Take 1 tablet by mouth 2 (two) times daily. 595 mg    . tiZANidine (ZANAFLEX) 4 MG tablet Take 4 mg by mouth 4 (four) times daily.      No current facility-administered medications for this visit.    Allergies:   Adalimumab; Arava; Aspirin; Codeine; Darvocet; Fioricet; Levofloxacin; Sertraline hcl; and Sulfa antibiotics    Social History:  The patient  reports that she has been smoking.  She does not have any smokeless tobacco history on file. She reports that she does not drink alcohol or use illicit drugs.   Family History:  The patient's family history includes Cancer in her brother and brother; Diabetes in her father and mother; Heart failure in her father and mother; Hypertension in her brother, father, mother, and sister.    ROS:  Please see the history of present illness.    Otherwise, review of systems positive for mild exertional dyspnea, some abdominal bloating, ankle edema only at the end of the day, resolved by the next morning..   All other systems are reviewed and negative.    PHYSICAL EXAM: VS:  BP 143/75 mmHg  Pulse 67  Ht 5' 3.5" (1.613 m)  Wt 168 lb (76.204 kg)  BMI 29.29 kg/m2 , BMI Body mass index is 29.29 kg/(m^2).  General: Alert, oriented x3, no distress Head: no evidence of trauma, PERRL, EOMI, no exophtalmos or lid lag, no myxedema, no xanthelasma; normal ears, nose and oropharynx Neck: normal jugular venous pulsations and no hepatojugular reflux; brisk carotid pulses without delay and no carotid bruits Chest: clear to auscultation, no signs of consolidation by percussion or palpation, normal fremitus, symmetrical and full respiratory excursions. There is a little skin retraction at the cranial end of the loop recorder, but the site looks  noninflamed and healthy. Cardiovascular: normal position and quality of the apical impulse, regular rhythm, normal first and second heart sounds, no murmurs, rubs or gallops Abdomen: no tenderness or distention, no masses by palpation, no abnormal pulsatility or arterial bruits, normal bowel sounds, no hepatosplenomegaly Extremities: no clubbing, cyanosis or edema; 2+ radial, ulnar and brachial pulses bilaterally; 2+ right femoral, posterior tibial and dorsalis pedis pulses; 2+ left femoral, posterior tibial and dorsalis pedis pulses; no subclavian or femoral bruits Neurological: grossly nonfocal Psych: euthymic mood, full affect   EKG:  EKG is not ordered today.  Lipid Panel    Component  Value Date/Time   CHOL  08/14/2008 0210    139        ATP III CLASSIFICATION:  <200     mg/dL   Desirable  200-239  mg/dL   Borderline High  >=240    mg/dL   High          TRIG 125 08/14/2008 0210   HDL 28* 08/14/2008 0210   CHOLHDL 5.0 08/14/2008 0210   VLDL 25 08/14/2008 0210   LDLCALC  08/14/2008 0210    86        Total Cholesterol/HDL:CHD Risk Coronary Heart Disease Risk Table                     Men   Women  1/2 Average Risk   3.4   3.3  Average Risk       5.0   4.4  2 X Average Risk   9.6   7.1  3 X Average Risk  23.4   11.0        Use the calculated Patient Ratio above and the CHD Risk Table to determine the patient's CHD Risk.        ATP III CLASSIFICATION (LDL):  <100     mg/dL   Optimal  100-129  mg/dL   Near or Above                    Optimal  130-159  mg/dL   Borderline  160-189  mg/dL   High  >190     mg/dL   Very High      Wt Readings from Last 3 Encounters:  07/07/14 168 lb (76.204 kg)  05/08/14 165 lb 1.6 oz (74.889 kg)  04/23/14 163 lb (73.936 kg)      Other studies Reviewed: Additional studies/ records that were reviewed today include: Loop recorder interrogation, which shows no detected events  ASSESSMENT AND PLAN:  LBBB (left bundle branch block), near  syncope Chronic, not associated with cardiomyopathy . So far no documentation of high-grade AV block  HTN (hypertension) Fair BP today. Avoid beta blockers, diltiazem or verapamil.  Hyperlipidemia Last labs were favorable.  Tobacco abuse Discussed need for smoking cessation again.   Current medicines are reviewed at length with the patient today.  The patient does not have concerns regarding medicines.  The following changes have been made:  no change  Labs/ tests ordered today include:  No orders of the defined types were placed in this encounter.    Patient Instructions  Dr. Sallyanne Kuster recommends that you schedule a follow-up appointment in: One year.     Mikael Spray, MD  07/07/2014 8:41 AM    Sanda Klein, MD, [facility] [phone] office [phone] pager

## 2014-07-07 NOTE — Patient Instructions (Signed)
Dr. Croitoru recommends that you schedule a follow-up appointment in: One year.   

## 2014-07-09 DIAGNOSIS — M150 Primary generalized (osteo)arthritis: Secondary | ICD-10-CM | POA: Diagnosis not present

## 2014-07-09 DIAGNOSIS — L4050 Arthropathic psoriasis, unspecified: Secondary | ICD-10-CM | POA: Diagnosis not present

## 2014-07-09 DIAGNOSIS — M545 Low back pain: Secondary | ICD-10-CM | POA: Diagnosis not present

## 2014-07-10 LAB — MDC_IDC_ENUM_SESS_TYPE_INCLINIC
Date Time Interrogation Session: 20160328112216
MDC IDC SET ZONE DETECTION INTERVAL: 2000 ms
MDC IDC SET ZONE DETECTION INTERVAL: 340 ms
Zone Setting Detection Interval: 3000 ms

## 2014-07-15 ENCOUNTER — Encounter: Payer: Self-pay | Admitting: Cardiovascular Disease

## 2014-07-21 ENCOUNTER — Ambulatory Visit (INDEPENDENT_AMBULATORY_CARE_PROVIDER_SITE_OTHER): Payer: Medicare Other | Admitting: *Deleted

## 2014-07-21 DIAGNOSIS — R55 Syncope and collapse: Secondary | ICD-10-CM

## 2014-07-24 NOTE — Progress Notes (Signed)
Loop recorder 

## 2014-07-31 ENCOUNTER — Encounter: Payer: Self-pay | Admitting: Cardiovascular Disease

## 2014-08-11 ENCOUNTER — Telehealth: Payer: Self-pay | Admitting: Cardiovascular Disease

## 2014-08-11 NOTE — Telephone Encounter (Signed)
Pt has a loop recorder. Lately she have this strange sound in her left ear. She wonder if might be coming from the recorder.

## 2014-08-11 NOTE — Telephone Encounter (Signed)
Sorry, don't think it has anything to do with loop recorder

## 2014-08-11 NOTE — Telephone Encounter (Signed)
Pt had loop recorder put in a few months ago.   She reports buzzing sound in left ear - inquiring if loop recorder may be causing this.  She notes that depending on how she is positioned, she can get a sharp "feedback" type of sound in ear. Almost seems like she is "picking up a signal". This has persisted x3-4 days.   Advised pt I would bring this to Dr. Victorino December attention, unsure of any recommendations to mitigate the problem in interim.

## 2014-08-11 NOTE — Telephone Encounter (Signed)
Pt advised on Dr. Lurline Del recommendations. She voiced understanding. She called PCP in interim - has appt set up w/ them for tomorrow to investigate other causes.

## 2014-08-12 DIAGNOSIS — J329 Chronic sinusitis, unspecified: Secondary | ICD-10-CM | POA: Diagnosis not present

## 2014-08-20 ENCOUNTER — Ambulatory Visit (INDEPENDENT_AMBULATORY_CARE_PROVIDER_SITE_OTHER): Payer: Medicare Other | Admitting: *Deleted

## 2014-08-20 DIAGNOSIS — R55 Syncope and collapse: Secondary | ICD-10-CM

## 2014-08-22 NOTE — Progress Notes (Signed)
Loop recorder 

## 2014-08-25 LAB — CUP PACEART REMOTE DEVICE CHECK: Date Time Interrogation Session: 20160516154117

## 2014-09-02 DIAGNOSIS — M4802 Spinal stenosis, cervical region: Secondary | ICD-10-CM | POA: Diagnosis not present

## 2014-09-02 DIAGNOSIS — M259 Joint disorder, unspecified: Secondary | ICD-10-CM | POA: Diagnosis not present

## 2014-09-02 DIAGNOSIS — L4050 Arthropathic psoriasis, unspecified: Secondary | ICD-10-CM | POA: Diagnosis not present

## 2014-09-02 DIAGNOSIS — M4690 Unspecified inflammatory spondylopathy, site unspecified: Secondary | ICD-10-CM | POA: Diagnosis not present

## 2014-09-10 LAB — CUP PACEART REMOTE DEVICE CHECK: MDC IDC SESS DTM: 20160601161246

## 2014-09-19 ENCOUNTER — Ambulatory Visit (INDEPENDENT_AMBULATORY_CARE_PROVIDER_SITE_OTHER): Payer: Medicare Other | Admitting: *Deleted

## 2014-09-19 DIAGNOSIS — R55 Syncope and collapse: Secondary | ICD-10-CM | POA: Diagnosis not present

## 2014-09-22 ENCOUNTER — Encounter: Payer: Self-pay | Admitting: Cardiovascular Disease

## 2014-09-22 NOTE — Progress Notes (Signed)
Loop recorder 

## 2014-09-23 ENCOUNTER — Encounter: Payer: Self-pay | Admitting: Cardiovascular Disease

## 2014-09-23 LAB — CUP PACEART REMOTE DEVICE CHECK: MDC IDC SESS DTM: 20160614093941

## 2014-10-20 ENCOUNTER — Ambulatory Visit (INDEPENDENT_AMBULATORY_CARE_PROVIDER_SITE_OTHER): Payer: Medicare Other | Admitting: *Deleted

## 2014-10-20 DIAGNOSIS — R55 Syncope and collapse: Secondary | ICD-10-CM

## 2014-10-21 ENCOUNTER — Encounter: Payer: Self-pay | Admitting: Cardiovascular Disease

## 2014-10-21 NOTE — Progress Notes (Signed)
Loop recorder 

## 2014-10-29 DIAGNOSIS — M259 Joint disorder, unspecified: Secondary | ICD-10-CM | POA: Diagnosis not present

## 2014-10-29 DIAGNOSIS — M533 Sacrococcygeal disorders, not elsewhere classified: Secondary | ICD-10-CM | POA: Diagnosis not present

## 2014-10-29 DIAGNOSIS — M4690 Unspecified inflammatory spondylopathy, site unspecified: Secondary | ICD-10-CM | POA: Diagnosis not present

## 2014-10-29 DIAGNOSIS — L4050 Arthropathic psoriasis, unspecified: Secondary | ICD-10-CM | POA: Diagnosis not present

## 2014-11-04 DIAGNOSIS — L259 Unspecified contact dermatitis, unspecified cause: Secondary | ICD-10-CM | POA: Diagnosis not present

## 2014-11-17 LAB — CUP PACEART REMOTE DEVICE CHECK: Date Time Interrogation Session: 20160808144107

## 2014-11-18 ENCOUNTER — Ambulatory Visit (INDEPENDENT_AMBULATORY_CARE_PROVIDER_SITE_OTHER): Payer: Medicare Other | Admitting: *Deleted

## 2014-11-18 DIAGNOSIS — R55 Syncope and collapse: Secondary | ICD-10-CM | POA: Diagnosis not present

## 2014-11-20 NOTE — Progress Notes (Signed)
Loop recorder 

## 2014-11-21 LAB — CUP PACEART REMOTE DEVICE CHECK: Date Time Interrogation Session: 20160812133930

## 2014-12-04 ENCOUNTER — Encounter: Payer: Self-pay | Admitting: Cardiovascular Disease

## 2014-12-18 ENCOUNTER — Ambulatory Visit (INDEPENDENT_AMBULATORY_CARE_PROVIDER_SITE_OTHER): Payer: Medicare Other | Admitting: *Deleted

## 2014-12-18 DIAGNOSIS — R55 Syncope and collapse: Secondary | ICD-10-CM

## 2014-12-22 ENCOUNTER — Encounter: Payer: Self-pay | Admitting: Cardiovascular Disease

## 2014-12-22 NOTE — Progress Notes (Signed)
Loop recorder 

## 2014-12-24 DIAGNOSIS — M533 Sacrococcygeal disorders, not elsewhere classified: Secondary | ICD-10-CM | POA: Diagnosis not present

## 2014-12-24 DIAGNOSIS — M4802 Spinal stenosis, cervical region: Secondary | ICD-10-CM | POA: Diagnosis not present

## 2014-12-24 DIAGNOSIS — L4050 Arthropathic psoriasis, unspecified: Secondary | ICD-10-CM | POA: Diagnosis not present

## 2014-12-24 DIAGNOSIS — M4690 Unspecified inflammatory spondylopathy, site unspecified: Secondary | ICD-10-CM | POA: Diagnosis not present

## 2014-12-30 DIAGNOSIS — Z1231 Encounter for screening mammogram for malignant neoplasm of breast: Secondary | ICD-10-CM | POA: Diagnosis not present

## 2015-01-12 DIAGNOSIS — E039 Hypothyroidism, unspecified: Secondary | ICD-10-CM | POA: Diagnosis not present

## 2015-01-12 DIAGNOSIS — I10 Essential (primary) hypertension: Secondary | ICD-10-CM | POA: Diagnosis not present

## 2015-01-12 DIAGNOSIS — E7800 Pure hypercholesterolemia, unspecified: Secondary | ICD-10-CM | POA: Diagnosis not present

## 2015-01-12 DIAGNOSIS — Z0000 Encounter for general adult medical examination without abnormal findings: Secondary | ICD-10-CM | POA: Diagnosis not present

## 2015-01-16 DIAGNOSIS — L4050 Arthropathic psoriasis, unspecified: Secondary | ICD-10-CM | POA: Diagnosis not present

## 2015-01-16 DIAGNOSIS — E039 Hypothyroidism, unspecified: Secondary | ICD-10-CM | POA: Diagnosis not present

## 2015-01-16 DIAGNOSIS — M545 Low back pain: Secondary | ICD-10-CM | POA: Diagnosis not present

## 2015-01-16 DIAGNOSIS — K219 Gastro-esophageal reflux disease without esophagitis: Secondary | ICD-10-CM | POA: Diagnosis not present

## 2015-01-16 DIAGNOSIS — Z23 Encounter for immunization: Secondary | ICD-10-CM | POA: Diagnosis not present

## 2015-01-16 LAB — CUP PACEART REMOTE DEVICE CHECK: MDC IDC SESS DTM: 20160909024509

## 2015-01-16 NOTE — Progress Notes (Signed)
[facility] summary report received. Battery status OK. Normal device function. No tachy episodes, brady, or pause episodes. No new AF episodes. 1 symptom episode. Monthly summary reports and ROV w/ [facility] 3/17.

## 2015-01-19 ENCOUNTER — Ambulatory Visit (INDEPENDENT_AMBULATORY_CARE_PROVIDER_SITE_OTHER): Payer: Medicare Other | Admitting: *Deleted

## 2015-01-19 DIAGNOSIS — Z79899 Other long term (current) drug therapy: Secondary | ICD-10-CM | POA: Diagnosis not present

## 2015-01-19 DIAGNOSIS — L4050 Arthropathic psoriasis, unspecified: Secondary | ICD-10-CM | POA: Diagnosis not present

## 2015-01-19 DIAGNOSIS — R55 Syncope and collapse: Secondary | ICD-10-CM | POA: Diagnosis not present

## 2015-01-19 DIAGNOSIS — M25519 Pain in unspecified shoulder: Secondary | ICD-10-CM | POA: Diagnosis not present

## 2015-01-19 DIAGNOSIS — M719 Bursopathy, unspecified: Secondary | ICD-10-CM | POA: Diagnosis not present

## 2015-01-20 NOTE — Progress Notes (Signed)
LOOP RECORDER  

## 2015-01-27 LAB — CUP PACEART REMOTE DEVICE CHECK: Date Time Interrogation Session: 20161009030844

## 2015-01-27 NOTE — Progress Notes (Signed)
Carelink summary report received. Battery status OK. Normal device function. No new symptom episodes, tachy episodes, brady, or pause episodes. No new AF episodes. Monthly summary reports and ROV with [facility] in 06/2015.

## 2015-01-30 ENCOUNTER — Encounter: Payer: Self-pay | Admitting: Cardiovascular Disease

## 2015-02-16 ENCOUNTER — Ambulatory Visit (INDEPENDENT_AMBULATORY_CARE_PROVIDER_SITE_OTHER): Payer: Medicare Other | Admitting: *Deleted

## 2015-02-16 DIAGNOSIS — R55 Syncope and collapse: Secondary | ICD-10-CM | POA: Diagnosis not present

## 2015-02-17 ENCOUNTER — Encounter: Payer: Self-pay | Admitting: Cardiovascular Disease

## 2015-02-17 NOTE — Progress Notes (Signed)
Carelink Summary Report / Loop recorder 

## 2015-02-23 DIAGNOSIS — L4050 Arthropathic psoriasis, unspecified: Secondary | ICD-10-CM | POA: Diagnosis not present

## 2015-02-23 DIAGNOSIS — Z5181 Encounter for therapeutic drug level monitoring: Secondary | ICD-10-CM | POA: Diagnosis not present

## 2015-02-23 DIAGNOSIS — M1288 Other specific arthropathies, not elsewhere classified, other specified site: Secondary | ICD-10-CM | POA: Diagnosis not present

## 2015-02-23 DIAGNOSIS — M533 Sacrococcygeal disorders, not elsewhere classified: Secondary | ICD-10-CM | POA: Diagnosis not present

## 2015-02-23 DIAGNOSIS — M4802 Spinal stenosis, cervical region: Secondary | ICD-10-CM | POA: Diagnosis not present

## 2015-03-06 LAB — CUP PACEART REMOTE DEVICE CHECK: MDC IDC SESS DTM: 20161108030645

## 2015-03-18 ENCOUNTER — Ambulatory Visit (INDEPENDENT_AMBULATORY_CARE_PROVIDER_SITE_OTHER): Payer: Medicare Other | Admitting: *Deleted

## 2015-03-18 DIAGNOSIS — R55 Syncope and collapse: Secondary | ICD-10-CM | POA: Diagnosis not present

## 2015-03-19 NOTE — Progress Notes (Signed)
Carelink Summary Report / Loop Recorder 

## 2015-04-17 ENCOUNTER — Ambulatory Visit (INDEPENDENT_AMBULATORY_CARE_PROVIDER_SITE_OTHER): Payer: Medicare Other | Admitting: *Deleted

## 2015-04-17 DIAGNOSIS — R55 Syncope and collapse: Secondary | ICD-10-CM

## 2015-04-21 NOTE — Progress Notes (Signed)
Carelink Summary Report / Loop Recorder 

## 2015-04-27 DIAGNOSIS — M533 Sacrococcygeal disorders, not elsewhere classified: Secondary | ICD-10-CM | POA: Diagnosis not present

## 2015-04-27 DIAGNOSIS — L4050 Arthropathic psoriasis, unspecified: Secondary | ICD-10-CM | POA: Diagnosis not present

## 2015-04-27 DIAGNOSIS — M4802 Spinal stenosis, cervical region: Secondary | ICD-10-CM | POA: Diagnosis not present

## 2015-04-27 DIAGNOSIS — M1288 Other specific arthropathies, not elsewhere classified, other specified site: Secondary | ICD-10-CM | POA: Diagnosis not present

## 2015-05-04 DIAGNOSIS — H9202 Otalgia, left ear: Secondary | ICD-10-CM | POA: Diagnosis not present

## 2015-05-04 DIAGNOSIS — E049 Nontoxic goiter, unspecified: Secondary | ICD-10-CM | POA: Diagnosis not present

## 2015-05-04 DIAGNOSIS — F17200 Nicotine dependence, unspecified, uncomplicated: Secondary | ICD-10-CM | POA: Diagnosis not present

## 2015-05-04 DIAGNOSIS — W57XXXS Bitten or stung by nonvenomous insect and other nonvenomous arthropods, sequela: Secondary | ICD-10-CM | POA: Diagnosis not present

## 2015-05-05 ENCOUNTER — Other Ambulatory Visit: Payer: Self-pay | Admitting: Internal Medicine

## 2015-05-05 DIAGNOSIS — E049 Nontoxic goiter, unspecified: Secondary | ICD-10-CM

## 2015-05-06 ENCOUNTER — Ambulatory Visit
Admission: RE | Admit: 2015-05-06 | Discharge: 2015-05-06 | Disposition: A | Discharge status: Home / Self Care | Payer: Medicare Other | Source: Ambulatory Visit | Attending: Internal Medicine | Admitting: Internal Medicine

## 2015-05-06 DIAGNOSIS — E049 Nontoxic goiter, unspecified: Secondary | ICD-10-CM

## 2015-05-06 DIAGNOSIS — E041 Nontoxic single thyroid nodule: Secondary | ICD-10-CM | POA: Diagnosis not present

## 2015-05-12 LAB — CUP PACEART REMOTE DEVICE CHECK: Date Time Interrogation Session: 20161208030807

## 2015-05-14 DIAGNOSIS — J0100 Acute maxillary sinusitis, unspecified: Secondary | ICD-10-CM | POA: Diagnosis not present

## 2015-05-18 ENCOUNTER — Ambulatory Visit (INDEPENDENT_AMBULATORY_CARE_PROVIDER_SITE_OTHER): Payer: Medicare Other | Admitting: *Deleted

## 2015-05-18 DIAGNOSIS — E7800 Pure hypercholesterolemia, unspecified: Secondary | ICD-10-CM | POA: Diagnosis not present

## 2015-05-18 DIAGNOSIS — Z79899 Other long term (current) drug therapy: Secondary | ICD-10-CM | POA: Diagnosis not present

## 2015-05-18 DIAGNOSIS — R55 Syncope and collapse: Secondary | ICD-10-CM

## 2015-05-18 DIAGNOSIS — R1031 Right lower quadrant pain: Secondary | ICD-10-CM | POA: Diagnosis not present

## 2015-05-18 DIAGNOSIS — I10 Essential (primary) hypertension: Secondary | ICD-10-CM | POA: Diagnosis not present

## 2015-05-19 DIAGNOSIS — R7301 Impaired fasting glucose: Secondary | ICD-10-CM | POA: Diagnosis not present

## 2015-05-19 DIAGNOSIS — E039 Hypothyroidism, unspecified: Secondary | ICD-10-CM | POA: Diagnosis not present

## 2015-05-19 DIAGNOSIS — R635 Abnormal weight gain: Secondary | ICD-10-CM | POA: Diagnosis not present

## 2015-05-19 DIAGNOSIS — E041 Nontoxic single thyroid nodule: Secondary | ICD-10-CM | POA: Diagnosis not present

## 2015-05-19 NOTE — Progress Notes (Signed)
Carelink Summary Report / Loop Recorder 

## 2015-05-21 DIAGNOSIS — R109 Unspecified abdominal pain: Secondary | ICD-10-CM | POA: Diagnosis not present

## 2015-05-21 DIAGNOSIS — R220 Localized swelling, mass and lump, head: Secondary | ICD-10-CM | POA: Diagnosis not present

## 2015-05-22 DIAGNOSIS — R319 Hematuria, unspecified: Secondary | ICD-10-CM | POA: Diagnosis not present

## 2015-05-22 DIAGNOSIS — K5900 Constipation, unspecified: Secondary | ICD-10-CM | POA: Diagnosis not present

## 2015-05-22 DIAGNOSIS — R102 Pelvic and perineal pain: Secondary | ICD-10-CM | POA: Diagnosis not present

## 2015-05-25 DIAGNOSIS — R102 Pelvic and perineal pain: Secondary | ICD-10-CM | POA: Diagnosis not present

## 2015-05-25 DIAGNOSIS — J321 Chronic frontal sinusitis: Secondary | ICD-10-CM | POA: Diagnosis not present

## 2015-05-25 DIAGNOSIS — R220 Localized swelling, mass and lump, head: Secondary | ICD-10-CM | POA: Diagnosis not present

## 2015-05-27 ENCOUNTER — Other Ambulatory Visit: Payer: Self-pay | Admitting: Internal Medicine

## 2015-05-27 DIAGNOSIS — J321 Chronic frontal sinusitis: Secondary | ICD-10-CM

## 2015-05-28 ENCOUNTER — Ambulatory Visit
Admission: RE | Admit: 2015-05-28 | Discharge: 2015-05-28 | Disposition: A | Discharge status: Home / Self Care | Payer: Medicare Other | Source: Ambulatory Visit | Attending: Internal Medicine | Admitting: Internal Medicine

## 2015-05-28 DIAGNOSIS — R0981 Nasal congestion: Secondary | ICD-10-CM | POA: Diagnosis not present

## 2015-05-28 DIAGNOSIS — J321 Chronic frontal sinusitis: Secondary | ICD-10-CM

## 2015-05-29 LAB — CUP PACEART REMOTE DEVICE CHECK: Date Time Interrogation Session: 20170107032259

## 2015-05-29 NOTE — Progress Notes (Signed)
Carelink summary report received. Battery status OK. Normal device function. No new symptom episodes, tachy episodes, brady, or pause episodes. No new AF episodes. Monthly summary reports and ROV/PRN 

## 2015-06-15 DIAGNOSIS — H538 Other visual disturbances: Secondary | ICD-10-CM | POA: Diagnosis not present

## 2015-06-15 DIAGNOSIS — R110 Nausea: Secondary | ICD-10-CM | POA: Diagnosis not present

## 2015-06-15 DIAGNOSIS — M1990 Unspecified osteoarthritis, unspecified site: Secondary | ICD-10-CM | POA: Diagnosis not present

## 2015-06-15 DIAGNOSIS — R51 Headache: Secondary | ICD-10-CM | POA: Diagnosis not present

## 2015-06-16 ENCOUNTER — Ambulatory Visit (INDEPENDENT_AMBULATORY_CARE_PROVIDER_SITE_OTHER): Payer: Medicare Other | Admitting: *Deleted

## 2015-06-16 DIAGNOSIS — R55 Syncope and collapse: Secondary | ICD-10-CM | POA: Diagnosis not present

## 2015-06-16 LAB — CUP PACEART REMOTE DEVICE CHECK: MDC IDC SESS DTM: 20170206033922

## 2015-06-16 NOTE — Progress Notes (Signed)
Carelink summary report received. Battery status OK. Normal device function. No new symptom episodes, tachy episodes, brady, or pause episodes. No new AF episodes. Monthly summary reports and ROV/PRN 

## 2015-06-19 NOTE — Progress Notes (Signed)
Carelink Summary Report / Loop Recorder 

## 2015-06-20 LAB — CUP PACEART REMOTE DEVICE CHECK: MDC IDC SESS DTM: 20170308040736

## 2015-06-20 NOTE — Progress Notes (Signed)
Carelink summary report received. Battery status OK. Normal device function. No new symptom episodes, tachy episodes, brady, or pause episodes. No new AF episodes. Monthly summary reports and ROV/PRN 

## 2015-06-24 DIAGNOSIS — M1288 Other specific arthropathies, not elsewhere classified, other specified site: Secondary | ICD-10-CM | POA: Diagnosis not present

## 2015-06-24 DIAGNOSIS — M533 Sacrococcygeal disorders, not elsewhere classified: Secondary | ICD-10-CM | POA: Diagnosis not present

## 2015-06-24 DIAGNOSIS — L4050 Arthropathic psoriasis, unspecified: Secondary | ICD-10-CM | POA: Diagnosis not present

## 2015-06-24 DIAGNOSIS — M4802 Spinal stenosis, cervical region: Secondary | ICD-10-CM | POA: Diagnosis not present

## 2015-06-25 DIAGNOSIS — N3020 Other chronic cystitis without hematuria: Secondary | ICD-10-CM | POA: Diagnosis not present

## 2015-06-25 DIAGNOSIS — R350 Frequency of micturition: Secondary | ICD-10-CM | POA: Diagnosis not present

## 2015-06-25 DIAGNOSIS — N200 Calculus of kidney: Secondary | ICD-10-CM | POA: Diagnosis not present

## 2015-06-25 DIAGNOSIS — Z0000 Encounter for general adult medical examination without abnormal findings: Secondary | ICD-10-CM | POA: Diagnosis not present

## 2015-06-29 ENCOUNTER — Encounter: Payer: Self-pay | Admitting: Neurology

## 2015-06-29 ENCOUNTER — Ambulatory Visit (INDEPENDENT_AMBULATORY_CARE_PROVIDER_SITE_OTHER): Payer: Medicare Other | Admitting: Neurology

## 2015-06-29 VITALS — BP 182/90 | HR 78 | Resp 18 | Ht 63.0 in | Wt 179.0 lb

## 2015-06-29 DIAGNOSIS — Z808 Family history of malignant neoplasm of other organs or systems: Secondary | ICD-10-CM

## 2015-06-29 DIAGNOSIS — Z8249 Family history of ischemic heart disease and other diseases of the circulatory system: Secondary | ICD-10-CM | POA: Diagnosis not present

## 2015-06-29 DIAGNOSIS — E669 Obesity, unspecified: Secondary | ICD-10-CM

## 2015-06-29 DIAGNOSIS — Z789 Other specified health status: Secondary | ICD-10-CM

## 2015-06-29 DIAGNOSIS — Z9189 Other specified personal risk factors, not elsewhere classified: Secondary | ICD-10-CM

## 2015-06-29 DIAGNOSIS — R51 Headache: Secondary | ICD-10-CM

## 2015-06-29 DIAGNOSIS — R519 Headache, unspecified: Secondary | ICD-10-CM

## 2015-06-29 DIAGNOSIS — R0683 Snoring: Secondary | ICD-10-CM | POA: Diagnosis not present

## 2015-06-29 DIAGNOSIS — R351 Nocturia: Secondary | ICD-10-CM | POA: Diagnosis not present

## 2015-06-29 NOTE — Progress Notes (Signed)
Subjective:    Patient ID: [name] is a 60 y.o. female.  HPI     Star Age, MD, PhD [facility] [address] P[address]O. [place],  [address]  Dear Dr. Shelia Media,   I saw your patient, [name], upon your kind request in my neurologic clinic today for initial consultation of her recurrent headaches. The patient is unaccompanied today. As you know, Ms. [name] is a 60 year old right-handed woman with an underlying complex medical history of psoriasis, psoriatic arthritis, Sjogren's syndrome, carotid stenosis, reflux disease, low back pain wth history of lumbar spinal stenosis reported, on chronic narcotic pain medications, smoking, osteopenia, glaucoma, hyperlipidemia, hypertension, fibromyalgia, laryngitis, hypothyroidism, on numerous medications including tizanidine, vitamin D, Zetia, methadone, oxycodone, losartan, Zyrtec, gabapentin, Flonase, Enbrel, Celebrex, prednisone (prn), Synthroid, and Lipitor, hx of left bundle-branch block, history of syncope and near syncope, status post loop recorder  placement, and obesity, who reports recurrent headaches for the past 2-3 months, associated with lethargy, blurry vision, facial swelling on both sides. She has bifrontal HAs and it may radiate to the top and back, b/l, associated with nausea, no vomiting.  She had migraine in her teenage years.  This HAs is worse as in more constant with more nausea.  She had a CT Head wo contrast on 02/28/11 for indication migraine:  IMPRESSION: No acute intracranial abnormalities.   She was no chronic prednisone some 15 years ago, now only when needed, last time used was months ago.  For her blurry vision she has seen ophthalmology a few months ago, and was told that her symptoms were not related to her glaucoma. I reviewed your office note from 06/15/18 17, which you kindly included. Recent blood work through your office included a CMP on 05/21/2015, which was  unremarkable. She had a sleep study some 6-7 years ago and states, she did not have OSA then. She has gained weight in the last 3 months, in the realm of 28 lb.   She had a positive ANA recently, with normal ESR. She sees a rheumatologist.   She smokes cigarettes, about 4-10 per day. She does not drink alcohol.  BP goes up and down. She drinks caffeine quite a bit: 4-6 cups of coffee and 2 cans of Cola.   She has nocturia up to 4 times a night, worse lately. She lives alone. She has 2 grown children. Her 46 year old granddaughter stays over often but has her own bedroom. She has noted snoring.  She is the youngest of 54 children, one brother had brain cancer, one had a brain aneurysm. One brother had mesothelioma.   No OSA, no Migraine in FHx.   Her Past Medical History Is Significant For: Past Medical History  Diagnosis Date  . Psoriasis   . Chronic back pain   . Hypertension   . Hypercholesteremia   . Osteopenia   . Hypothyroid   . Fibromyalgia   . Addiction, opium ([place])   . GERD (gastroesophageal reflux disease)   . Laryngitis   . LBBB (left bundle branch block)   . Glaucoma   . Osteopenia   . Hyperlipemia   . Hypokalemia   . Sjogren's syndrome ([place])   . Arthritis     Her Past Surgical History Is Significant For: Past Surgical History  Procedure Laterality Date  . Cesarean section    . Appendectomy    . Laparoscopic ovarian cystectomy    . Tonsillectomy    . Cardiac catheterization  06/18/2002  normal  . US echocardiography  08/05/2010    normal  . Nm myocar perf wall motion  07/17/2009    fixed anteroseptal defect,no ischemia  . Loop recorder implant N/A 04/22/2014    Procedure: LOOP RECORDER IMPLANT;  Surgeon: Sanda Klein, MD;  Location: [facility];  Service: Cardiovascular;  Laterality: N/A;    Her Family History Is Significant For: Family History  Problem Relation Age of Onset  . Hypertension Mother   . Heart failure Mother   . Diabetes Mother   .  Diabetes Father   . Heart failure Father   . Hypertension Father   . Cancer Brother     brain  . Cancer Brother     lung  . Hypertension Brother   . Hypertension Sister     Her Social History Is Significant For: Social History   Social History  . Marital Status: Divorced    Spouse Name: N/A  . Number of Children: 2  . Years of Education: N/A   Occupational History  . Disabled     Social History Main Topics  . Smoking status: Current Every Day Smoker -- 1.00 packs/day  . Smokeless tobacco: None  . Alcohol Use: No  . Drug Use: No  . Sexual Activity: Not Asked   Other Topics Concern  . None   Social History Narrative   Drinks 4-6 cups of coffee a day and 2 sodas     Her Allergies Are:  Allergies  Allergen Reactions  . Adalimumab Nausea And Vomiting and Other (See Comments)    Headache,   . Arava [Leflunomide] Other (See Comments)    Heart races   . Aspirin Other (See Comments)    Upset stomach, burning sensation.  . Codeine Nausea And Vomiting and Other (See Comments)    Dizzy,heart speeds up  . Darvocet [Propoxyphene N-Acetaminophen] Other (See Comments)    Heart races  . Fioricet [Butalbital-Apap-Caffeine] Other (See Comments)    Heart races.  . Levofloxacin Nausea And Vomiting  . Sertraline Hcl Other (See Comments)    Patient states "like a movie going on and can see killing someone else"  . Sulfa Antibiotics Diarrhea and Other (See Comments)    Stomach upset  :   Her Current Medications Are:  Outpatient Encounter Prescriptions as of 06/29/2015  Medication Sig  . albuterol (PROAIR HFA) 108 (90 BASE) MCG/ACT inhaler Inhale 2 puffs into the lungs every 6 (six) hours as needed for wheezing or shortness of breath.  Marland Kitchen atorvastatin (LIPITOR) 40 MG tablet Take 40 mg by mouth daily.  . celecoxib (CELEBREX) 200 MG capsule Take 200 mg by mouth daily.    . cetirizine (ZYRTEC) 10 MG tablet Take 10 mg by mouth daily as needed for allergies.   . Cholecalciferol  (VITAMIN D) 2000 UNITS tablet Take 2,000 Units by mouth 2 (two) times daily.  . Cinnamon 500 MG capsule Take 500 mg by mouth 2 (two) times daily.  . diclofenac sodium (VOLTAREN) 1 % GEL Apply 1 application topically 3 (three) times daily as needed (pain). On hands and knees  . etanercept (ENBREL) 50 MG/ML injection Inject 50 mg into the skin 2 (two) times a week. Tuesday and Friday.  . ezetimibe (ZETIA) 10 MG tablet Take 5 mg by mouth daily.  . fluticasone (FLONASE) 50 MCG/ACT nasal spray Place into both nostrils daily.  Marland Kitchen gabapentin (NEURONTIN) 600 MG tablet Take 600 mg by mouth 4 (four) times daily.  Marland Kitchen levothyroxine (SYNTHROID, LEVOTHROID) 112 MCG  tablet Take 112 mcg by mouth daily before breakfast.   . lidocaine (LIDODERM) 5 % Place 1 patch onto the skin daily. Remove & Discard patch within 12 hours or as directed by MD  . losartan-hydrochlorothiazide (HYZAAR) 100-25 MG per tablet Take 1 tablet by mouth daily.  . methadone (DOLOPHINE) 10 MG tablet Take 10 mg by mouth 4 (four) times daily.   . Omega-3 Fatty Acids (FISH OIL) 1000 MG CAPS Take 1,000 mg by mouth 2 (two) times daily.  Marland Kitchen oxyCODONE-acetaminophen (ENDOCET) 10-325 MG per tablet Take 1 tablet by mouth 3 (three) times daily as needed for pain.  Marland Kitchen tiZANidine (ZANAFLEX) 4 MG tablet Take 4 mg by mouth 4 (four) times daily.   . [DISCONTINUED] atorvastatin (LIPITOR) 40 MG tablet Take 40 mg by mouth at bedtime.    No facility-administered encounter medications on file as of 06/29/2015.  :  Review of Systems:  Out of a complete 14 point review of systems, all are reviewed and negative with the exception of these symptoms as listed below:   Review of Systems  Constitutional: Positive for fatigue.  HENT: Positive for tinnitus and trouble swallowing.   Respiratory: Positive for cough, shortness of breath and wheezing.   Cardiovascular:       Some carotid artery blockage   Musculoskeletal:       Joint pain, joint swelling, cramps, aching  muscles.   Neurological: Positive for dizziness, weakness and numbness.       Patient reports that she has episodes of of headache accompanied with facial swelling and blurred vision. States that this started around Christmas.  Sleepiness, restless legs, confusion  Psychiatric/Behavioral:       Not enough sleep, decreased energy     Objective:  Neurologic Exam  Physical Exam Physical Examination:   Filed Vitals:   06/29/15 0856  BP: 182/90  Pulse: 78  Resp: 18   General Examination: The patient is a very pleasant 60 y.o. female in no acute distress. She appears well-developed and well-nourished and well groomed. She is obese.   HEENT: Normocephalic, atraumatic, pupils are equal, round and reactive to light and accommodation. Funduscopic exam is normal with sharp disc margins noted. Extraocular tracking is good without limitation to gaze excursion or nystagmus noted. Normal smooth pursuit is noted. Hearing is grossly intact. Face is symmetric with normal facial animation and normal facial sensation. Speech is clear with no dysarthria noted. There is no hypophonia. There is no lip, neck/head, jaw or voice tremor. Neck is supple with full range of passive and active motion. There are no carotid bruits on auscultation. Oropharynx exam reveals: moderate mouth dryness, adequate dental hygiene and moderate airway crowding, due to thicker soft palate and larger uvula. Mallampati is class II. Tongue protrudes centrally and palate elevates symmetrically. Tonsils are absent. Neck size is 15.5 inches. She has a Mild overbite.  Chest: Clear to auscultation without wheezing, rhonchi or crackles noted.  Heart: S1+S2+0, regular and normal without murmurs, rubs or gallops noted.   Abdomen: Soft, non-tender and non-distended with normal bowel sounds appreciated on auscultation.  Extremities: There is no pitting edema in the distal lower extremities bilaterally. Pedal pulses are intact.  Skin: Warm and  dry without trophic changes noted. There are no varicose veins.  Musculoskeletal: exam reveals prominent joint deformities in hands and feet.   Neurologically:  Mental status: The patient is awake, alert and oriented in all 4 spheres. Her immediate and remote memory, attention, language skills and fund of knowledge  are appropriate. There is no evidence of aphasia, agnosia, apraxia or anomia. Speech is clear with normal prosody and enunciation. Thought process is linear. Mood is normal and affect is normal.  Cranial nerves II - XII are as described above under HEENT exam. In addition: shoulder shrug is normal with equal shoulder height noted. Motor exam: Normal bulk, strength and tone is noted. There is no drift, tremor or rebound. Romberg is negative. Reflexes are 1+ throughout, except trace in the ankles. Babinski: Toes are flexor bilaterally. Fine motor skills and coordination: intact with normal finger taps, normal hand movements, normal rapid alternating patting, normal foot taps and normal foot agility.  Cerebellar testing: No dysmetria or intention tremor on finger to nose testing. Heel to shin is unremarkable bilaterally. There is no truncal or gait ataxia.  Sensory exam: intact to light touch, pinprick, vibration, temperature sense in the upper and lower extremities.  Gait, station and balance: She stands easily. No veering to one side is noted. No leaning to one side is noted. Posture is age-appropriate and stance is narrow based. Gait shows normal stride length and normal pace. No problems turning are noted. She turns en bloc. Tandem walk is unremarkable.  Assessment and Plan:  In summary, TIANNE PLOTT is a very pleasant 60 y.o.-year old female with an underlying complex medical history of psoriasis, psoriatic arthritis, Sjogren's syndrome, carotid stenosis, reflux disease, low back pain wth history of lumbar spinal stenosis reported, on chronic narcotic pain medications, smoking,  osteopenia, glaucoma, hyperlipidemia, hypertension, fibromyalgia, laryngitis, hypothyroidism, on numerous medications including tizanidine, vitamin D, Zetia, methadone, oxycodone, losartan, Zyrtec, gabapentin, Flonase, Enbrel, Celebrex, prednisone (prn), Synthroid, and Lipitor, hx of left bundle-branch block, history of syncope and near syncope, status post loop recorder  placement, and obesity, who reports recurrent headaches for the past 2-3 months, associated with lethargy, blurry vision, facial swelling on both sides. She has bifrontal HAs and it may radiate to the top and back, b/l, associated with nausea, no vomiting. Her history is not telltale for migrainous headaches or any particular headache syndromes such as cluster headache. She has had a daily headache but it goes from a mild to severe headache and because of the bilateral nature, it is somewhat difficult to tease out what is really going on. She has some concerning history and physical exam for obstructive sleep apnea as well. She reports having had a sleep study several years ago but she has gained weight particularly in the last 2 or 3 months. She has had blood pressure fluctuations as well and reports significant nocturia. In addition, she drinks quite a bit of caffeine. She also smokes. Her history is complicated. Thankfully, her physical exam, and neurological exam are nonfocal and I reassured her in that regard. I had a long discussion with the patient today about her symptoms and the diagnosis of recurrent headaches. In particular, this is all the more complicated because she is on numerous medications including chronic narcotic pain medications, gabapentin, muscle relaxer, and adding more and more medication is certainly not the way to go. She is advised to stop smoking, she is advised to limit her caffeine and do this gradually, perhaps limiting herself to 2 caffeine beverages per day and make sure she drinks plenty of water. She indicates  that she drinks about 3-4 bottles of water, 16 ounces each. We talked about medical treatments and non-pharmacological approaches. We talked about smoking cessation and maintaining a healthy lifestyle in general. I encouraged the patient to eat  healthy, exercise daily and keep well hydrated, to keep a scheduled bedtime and wake time routine, to not skip any meals and eat healthy snacks in between meals and to have protein with every meal.   I advised the patient about common headache triggers: sleep deprivation, dehydration, overheating, stress, hypoglycemia or skipping meals and blood sugar fluctuations, excessive pain medications or excessive alcohol use or caffeine withdrawal. Some people have food triggers such as aged cheese, orange juice or chocolate, especially dark chocolate, or MSG (monosodium glutamate). She is to try to avoid these headache triggers as much possible. It may be helpful to keep a headache diary to figure out what makes Her headaches worse or brings them on and what alleviates them. Some people report headache onset after exercise but studies have shown that regular exercise may actually prevent headaches from coming. If She has exercise-induced headaches, She is advised to drink plenty of fluid before and after exercising and that to not overdo it and to not overheat. Unfortunately, because of her prominent arthritis, she is very limited in her physical activity. She is supposed to start seeing a new rheumatologist as I understand and recently had a positive ANA which is new per patient report.  As far as further diagnostic testing is concerned, I suggested the following today: MRI brain w and w/o Gad and EEG . She reports feeling lethargic at times and in a brain fog. Of note, she has a family history of brain aneurysm and brain stem cancer.  As far as medications are concerned, I recommended the following at this time: no change. She is advised that we may consider yet another  medication to prevent headaches down the [place] but may want to also streamline her medication. I feel reluctant to suggest anything quite yet. She is not keen on trying more and more medications. She also indicated some side effects with the gabapentin. She sees pain management out of [place].  I would like to see her back in about 3 months, sooner if needed, and in the interim, we'll keep her posted as to her test results in the interim. I answered all her questions today and the patient was in agreement with the above outlined plan.   Thank you very much for allowing me to participate in the care of this nice patient. If I can be of any further assistance to you please do not hesitate to call me at [phone].  Sincerely,   Star Age, MD, PhD

## 2015-06-29 NOTE — Patient Instructions (Addendum)
Please remember, common headache triggers are: sleep deprivation, dehydration, overheating, stress, hypoglycemia or skipping meals and blood sugar fluctuations, excessive pain medications or excessive alcohol use or caffeine withdrawal. Some people have food triggers such as aged cheese, orange juice or chocolate, especially dark chocolate, or MSG (monosodium glutamate). Try to avoid these headache triggers as much possible. It may be helpful to keep a headache diary to figure out what makes your headaches worse or brings them on and what alleviates them. Some people report headache onset after exercise but studies have shown that regular exercise may actually prevent headaches from coming. If you have exercise-induced headaches, please make sure that you drink plenty of fluid before and after exercising and that you do not over do it and do not overheat.  Please talk to Dr. Shelia Media about doing a sleep study. You may be at risk for obstructive sleep apnea.   We will do a brain scan, called MRI and call you with the test results. We will have to schedule you for this on a separate date. This test requires authorization from your insurance, and we will take care of the insurance process.  We will do an EEG (brainwave test), which we will schedule. We will call you with the results.  We will hold off on any new medication at this time. You are on numerous medications already.   Please limit your caffeine to 2 servings a day, gradually reducing it and please stop smoking.

## 2015-06-30 ENCOUNTER — Ambulatory Visit (INDEPENDENT_AMBULATORY_CARE_PROVIDER_SITE_OTHER): Payer: Medicare Other

## 2015-06-30 DIAGNOSIS — R55 Syncope and collapse: Secondary | ICD-10-CM

## 2015-06-30 DIAGNOSIS — R519 Headache, unspecified: Secondary | ICD-10-CM

## 2015-06-30 DIAGNOSIS — Z789 Other specified health status: Secondary | ICD-10-CM

## 2015-06-30 DIAGNOSIS — Z8249 Family history of ischemic heart disease and other diseases of the circulatory system: Secondary | ICD-10-CM

## 2015-06-30 DIAGNOSIS — R0683 Snoring: Secondary | ICD-10-CM

## 2015-06-30 DIAGNOSIS — Z808 Family history of malignant neoplasm of other organs or systems: Secondary | ICD-10-CM

## 2015-06-30 DIAGNOSIS — R51 Headache: Principal | ICD-10-CM

## 2015-06-30 DIAGNOSIS — R351 Nocturia: Secondary | ICD-10-CM

## 2015-06-30 DIAGNOSIS — E669 Obesity, unspecified: Secondary | ICD-10-CM

## 2015-06-30 NOTE — Progress Notes (Signed)

## 2015-06-30 NOTE — Procedures (Signed)
    History:   [name] is a 60 year old patient with a history of syncope and near-syncope, and a history of headaches. The patient reports some cognitive clouding that occurs at times. The patient is being evaluated for these issues.  This is a routine EEG. No skull defects are noted. Medications include albuterol, Lipitor, Celebrex, Zyrtec, vitamin D, Enbrel, diclofenac, Zetia, Flonase, gabapentin, Synthroid, Hyzaar, methadone, fish oil, oxycodone, and tizanidine.  EEG classification: Normal awake  Description of the recording: The background rhythms of this recording consists of a fairly well modulated medium amplitude alpha rhythm of 10 Hz that is reactive to eye opening and closure. As the record progresses, the patient appears to remain in the waking state throughout the recording. At times, significant electrode artifact is seen at the T5 and T3 electrodes. Photic stimulation was performed, resulting in a bilateral and symmetric photic driving response. Hyperventilation was also performed, resulting in a minimal buildup of the background rhythm activities without significant slowing seen. At no time during the recording does there appear to be evidence of spike or spike wave discharges or evidence of focal slowing. EKG monitor shows no evidence of cardiac rhythm abnormalities with a heart rate of 60.  Impression: This is a normal EEG recording in the waking state. No evidence of ictal or interictal discharges are seen. This is at times technically difficult secondary to electrode artifact.

## 2015-07-01 ENCOUNTER — Telehealth: Payer: Self-pay

## 2015-07-01 NOTE — Telephone Encounter (Signed)
I spoke to patient and she is aware of results and recommendations.  

## 2015-07-01 NOTE — Telephone Encounter (Signed)
-----   Message from Star Age, MD sent at 06/30/2015  6:14 PM EDT ----- Please call and advise the patient that the EEG or brain wave test we performed was reported as normal in the awake state. We checked for abnormal electrical discharges in the brain waves and the report suggested normal findings. No further action is required on this test at this time. Please remind patient to keep any upcoming appointments or tests and to call us with any interim questions, concerns, problems or updates. Thanks,  Star Age, MD, PhD

## 2015-07-02 DIAGNOSIS — R350 Frequency of micturition: Secondary | ICD-10-CM | POA: Diagnosis not present

## 2015-07-02 DIAGNOSIS — N3020 Other chronic cystitis without hematuria: Secondary | ICD-10-CM | POA: Diagnosis not present

## 2015-07-02 DIAGNOSIS — R3129 Other microscopic hematuria: Secondary | ICD-10-CM | POA: Diagnosis not present

## 2015-07-02 DIAGNOSIS — Z0000 Encounter for general adult medical examination without abnormal findings: Secondary | ICD-10-CM | POA: Diagnosis not present

## 2015-07-02 DIAGNOSIS — N941 Unspecified dyspareunia: Secondary | ICD-10-CM | POA: Diagnosis not present

## 2015-07-06 DIAGNOSIS — R3129 Other microscopic hematuria: Secondary | ICD-10-CM | POA: Diagnosis not present

## 2015-07-08 ENCOUNTER — Ambulatory Visit
Admission: RE | Admit: 2015-07-08 | Discharge: 2015-07-08 | Disposition: A | Discharge status: Home / Self Care | Payer: Medicare Other | Source: Ambulatory Visit | Attending: Neurology | Admitting: Neurology

## 2015-07-08 DIAGNOSIS — R51 Headache: Principal | ICD-10-CM

## 2015-07-08 DIAGNOSIS — Z789 Other specified health status: Secondary | ICD-10-CM

## 2015-07-08 DIAGNOSIS — R0683 Snoring: Secondary | ICD-10-CM

## 2015-07-08 DIAGNOSIS — R351 Nocturia: Secondary | ICD-10-CM

## 2015-07-08 DIAGNOSIS — Z8249 Family history of ischemic heart disease and other diseases of the circulatory system: Secondary | ICD-10-CM

## 2015-07-08 DIAGNOSIS — R519 Headache, unspecified: Secondary | ICD-10-CM

## 2015-07-08 DIAGNOSIS — E669 Obesity, unspecified: Secondary | ICD-10-CM

## 2015-07-08 DIAGNOSIS — Z808 Family history of malignant neoplasm of other organs or systems: Secondary | ICD-10-CM

## 2015-07-08 MED ORDER — GADOBENATE DIMEGLUMINE 529 MG/ML IV SOLN
16.0000 mL | Freq: Once | INTRAVENOUS | Status: AC | PRN
  Administered 2015-07-08: 16 mL via INTRAVENOUS

## 2015-07-09 ENCOUNTER — Other Ambulatory Visit: Payer: Self-pay | Admitting: Urology

## 2015-07-09 DIAGNOSIS — Z0000 Encounter for general adult medical examination without abnormal findings: Secondary | ICD-10-CM | POA: Diagnosis not present

## 2015-07-09 DIAGNOSIS — N3020 Other chronic cystitis without hematuria: Secondary | ICD-10-CM | POA: Diagnosis not present

## 2015-07-09 DIAGNOSIS — N281 Cyst of kidney, acquired: Secondary | ICD-10-CM | POA: Diagnosis not present

## 2015-07-13 NOTE — Progress Notes (Signed)
Quick Note:  Please call patient regarding the recent brain MRI: The brain scan showed a normal structure of the brain and no significant volume loss which we call atrophy. There were changes in the deeper structures of the brain, which we call white matter changes or microvascular changes. These were reported as mild in Her case. These are tiny white spots, that occur with time and are seen in a variety of conditions, including with normal aging, chronic hypertension, chronic headaches, especially migraine HAs, chronic diabetes, chronic hyperlipidemia. These are not strokes and no mass or lesion were seen which is reassuring. Again, there were no acute findings, such as a stroke, or mass or blood products. No further action is required on this test at this time, other than re-enforcing the importance of good blood pressure control, good cholesterol control, good blood sugar control, and weight management. Overall, no significant change from Feb 2016, which is good.  Please remind patient to keep any upcoming appointments or tests and to call us with any interim questions, concerns, problems or updates. Thanks,  Star Age, MD, PhD    ______

## 2015-07-14 ENCOUNTER — Ambulatory Visit (HOSPITAL_COMMUNITY)
Admission: RE | Admit: 2015-07-14 | Discharge: 2015-07-14 | Disposition: A | Discharge status: Home / Self Care | Payer: Medicare Other | Source: Ambulatory Visit | Attending: Urology | Admitting: Urology

## 2015-07-14 DIAGNOSIS — N281 Cyst of kidney, acquired: Secondary | ICD-10-CM | POA: Diagnosis not present

## 2015-07-14 MED ORDER — GADOBENATE DIMEGLUMINE 529 MG/ML IV SOLN
20.0000 mL | Freq: Once | INTRAVENOUS | Status: AC | PRN
  Administered 2015-07-14: 16 mL via INTRAVENOUS

## 2015-07-15 ENCOUNTER — Telehealth: Payer: Self-pay | Admitting: Neurology

## 2015-07-15 DIAGNOSIS — E220 Acromegaly and pituitary gigantism: Secondary | ICD-10-CM | POA: Diagnosis not present

## 2015-07-15 DIAGNOSIS — R29898 Other symptoms and signs involving the musculoskeletal system: Secondary | ICD-10-CM | POA: Diagnosis not present

## 2015-07-15 DIAGNOSIS — R220 Localized swelling, mass and lump, head: Secondary | ICD-10-CM | POA: Diagnosis not present

## 2015-07-15 NOTE — Telephone Encounter (Signed)
-----   Message from Star Age, MD sent at 07/13/2015  9:11 AM EDT ----- Please call patient regarding the recent brain MRI: The brain scan showed a normal structure of the brain and no significant volume loss which we call atrophy. There were changes in the deeper structures of the brain, which we call white matter changes or microvascular changes. These were reported as mild in Her case. These are tiny white spots, that occur with time and are seen in a variety of conditions, including with normal aging, chronic hypertension, chronic headaches, especially migraine HAs, chronic diabetes, chronic hyperlipidemia. These are not strokes and no mass or lesion were seen which is reassuring. Again, there were no acute findings, such as a stroke, or mass or blood products. No further action is required on this test at this time, other than re-enforcing the importance of good blood pressure control, good cholesterol control, good blood sugar control, and weight management. Overall, no significant change from Feb 2016, which is good.  Please remind patient to keep any upcoming appointments or tests and to call us with any interim questions, concerns, problems or updates. Thanks,  Star Age, MD, PhD

## 2015-07-15 NOTE — Telephone Encounter (Signed)
I spoke to patient and she is aware of results and recommendations. Voiced understanding.  

## 2015-07-15 NOTE — Telephone Encounter (Signed)
Pt requesting MRI results.

## 2015-07-17 ENCOUNTER — Ambulatory Visit (INDEPENDENT_AMBULATORY_CARE_PROVIDER_SITE_OTHER): Payer: Medicare Other | Admitting: *Deleted

## 2015-07-17 DIAGNOSIS — R55 Syncope and collapse: Secondary | ICD-10-CM | POA: Diagnosis not present

## 2015-07-17 NOTE — Progress Notes (Signed)
Carelink Summary Report / Loop Recorder 

## 2015-07-20 DIAGNOSIS — M150 Primary generalized (osteo)arthritis: Secondary | ICD-10-CM | POA: Diagnosis not present

## 2015-07-20 DIAGNOSIS — Z79899 Other long term (current) drug therapy: Secondary | ICD-10-CM | POA: Diagnosis not present

## 2015-07-20 DIAGNOSIS — R768 Other specified abnormal immunological findings in serum: Secondary | ICD-10-CM | POA: Diagnosis not present

## 2015-07-20 DIAGNOSIS — L4050 Arthropathic psoriasis, unspecified: Secondary | ICD-10-CM | POA: Diagnosis not present

## 2015-07-20 NOTE — Telephone Encounter (Signed)
Dr Brett Fairy- please advise, thank you

## 2015-07-20 NOTE — Telephone Encounter (Signed)
Patient called back and stated that Dr. Madolyn Frieze took bloodwork and told her that according to her hormone levels she has brain tumors.  She is asking why if she has them did they not show up on the MRI.  Please call.

## 2015-07-21 NOTE — Telephone Encounter (Signed)
There is no blood test for brain cancer or tumor unless is a glandular tumor, such as an adenoma.  Please have Dr Shea Evans get in touch with Dr Rexene Alberts next week.  Please advise to use MY CHART. CD

## 2015-07-21 NOTE — Telephone Encounter (Signed)
Called pt back. Relayed Dr Brett Fairy message that she is not aware of a blood test for brain cancer or tumor. Advised Dr Rexene Alberts out of office this week and we will relay message for Dr Rexene Alberts to contact Dr Shea Evans to discuss. Pt ok with this. She would like a call back next week to let her know what they discussed.

## 2015-07-23 DIAGNOSIS — E229 Hyperfunction of pituitary gland, unspecified: Secondary | ICD-10-CM | POA: Diagnosis not present

## 2015-07-29 DIAGNOSIS — E220 Acromegaly and pituitary gigantism: Secondary | ICD-10-CM | POA: Diagnosis not present

## 2015-07-29 DIAGNOSIS — E229 Hyperfunction of pituitary gland, unspecified: Secondary | ICD-10-CM | POA: Diagnosis not present

## 2015-07-31 ENCOUNTER — Ambulatory Visit
Admission: RE | Admit: 2015-07-31 | Discharge: 2015-07-31 | Disposition: A | Discharge status: Home / Self Care | Payer: Medicare Other | Source: Ambulatory Visit | Attending: Allergy | Admitting: Allergy

## 2015-07-31 ENCOUNTER — Other Ambulatory Visit: Payer: Self-pay | Admitting: Allergy

## 2015-07-31 DIAGNOSIS — J4520 Mild intermittent asthma, uncomplicated: Secondary | ICD-10-CM

## 2015-07-31 DIAGNOSIS — F17200 Nicotine dependence, unspecified, uncomplicated: Secondary | ICD-10-CM | POA: Diagnosis not present

## 2015-07-31 DIAGNOSIS — J309 Allergic rhinitis, unspecified: Secondary | ICD-10-CM | POA: Diagnosis not present

## 2015-07-31 DIAGNOSIS — R0602 Shortness of breath: Secondary | ICD-10-CM | POA: Diagnosis not present

## 2015-07-31 DIAGNOSIS — T783XXA Angioneurotic edema, initial encounter: Secondary | ICD-10-CM | POA: Diagnosis not present

## 2015-08-11 DIAGNOSIS — N281 Cyst of kidney, acquired: Secondary | ICD-10-CM | POA: Diagnosis not present

## 2015-08-11 DIAGNOSIS — Z0000 Encounter for general adult medical examination without abnormal findings: Secondary | ICD-10-CM | POA: Diagnosis not present

## 2015-08-17 ENCOUNTER — Ambulatory Visit (INDEPENDENT_AMBULATORY_CARE_PROVIDER_SITE_OTHER): Payer: Medicare Other | Admitting: *Deleted

## 2015-08-17 DIAGNOSIS — R55 Syncope and collapse: Secondary | ICD-10-CM

## 2015-08-18 NOTE — Progress Notes (Signed)
Carelink Summary Report / Loop Recorder 

## 2015-08-25 DIAGNOSIS — M4802 Spinal stenosis, cervical region: Secondary | ICD-10-CM | POA: Diagnosis not present

## 2015-08-25 DIAGNOSIS — M1288 Other specific arthropathies, not elsewhere classified, other specified site: Secondary | ICD-10-CM | POA: Diagnosis not present

## 2015-08-25 DIAGNOSIS — L4050 Arthropathic psoriasis, unspecified: Secondary | ICD-10-CM | POA: Diagnosis not present

## 2015-09-01 DIAGNOSIS — T783XXD Angioneurotic edema, subsequent encounter: Secondary | ICD-10-CM | POA: Diagnosis not present

## 2015-09-05 LAB — CUP PACEART REMOTE DEVICE CHECK: Date Time Interrogation Session: 20170407043852

## 2015-09-05 NOTE — Progress Notes (Signed)
Carelink summary report received. Battery status OK. Normal device function. No new symptom episodes, tachy episodes, brady, or pause episodes. No new AF episodes. Monthly summary reports and ROV/PRN 

## 2015-09-10 DIAGNOSIS — H40003 Preglaucoma, unspecified, bilateral: Secondary | ICD-10-CM | POA: Diagnosis not present

## 2015-09-10 DIAGNOSIS — H2513 Age-related nuclear cataract, bilateral: Secondary | ICD-10-CM | POA: Diagnosis not present

## 2015-09-15 ENCOUNTER — Ambulatory Visit (INDEPENDENT_AMBULATORY_CARE_PROVIDER_SITE_OTHER): Payer: Medicare Other | Admitting: *Deleted

## 2015-09-15 DIAGNOSIS — R55 Syncope and collapse: Secondary | ICD-10-CM | POA: Diagnosis not present

## 2015-09-15 DIAGNOSIS — R220 Localized swelling, mass and lump, head: Secondary | ICD-10-CM | POA: Diagnosis not present

## 2015-09-15 NOTE — Progress Notes (Signed)
Carelink Summary Report / Loop Recorder 

## 2015-09-23 LAB — CUP PACEART REMOTE DEVICE CHECK: MDC IDC SESS DTM: 20170507043804

## 2015-09-24 DIAGNOSIS — E119 Type 2 diabetes mellitus without complications: Secondary | ICD-10-CM | POA: Diagnosis not present

## 2015-09-29 DIAGNOSIS — I10 Essential (primary) hypertension: Secondary | ICD-10-CM | POA: Diagnosis not present

## 2015-10-01 ENCOUNTER — Encounter: Payer: Self-pay | Admitting: Neurology

## 2015-10-01 ENCOUNTER — Ambulatory Visit (INDEPENDENT_AMBULATORY_CARE_PROVIDER_SITE_OTHER): Payer: Medicare Other | Admitting: Neurology

## 2015-10-01 VITALS — BP 132/70 | HR 72 | Resp 16 | Ht 63.0 in | Wt 182.0 lb

## 2015-10-01 DIAGNOSIS — R51 Headache: Secondary | ICD-10-CM

## 2015-10-01 DIAGNOSIS — R519 Headache, unspecified: Secondary | ICD-10-CM

## 2015-10-01 NOTE — Progress Notes (Signed)
Subjective:    Patient ID: [name] is a 60 y.o. female.  HPI    Interim history:   [name] is a 60 year old right-handed woman with an underlying complex medical history of psoriasis, psoriatic arthritis, Sjogren's syndrome, carotid stenosis, reflux disease, low back pain wth history of lumbar spinal stenosis reported, on chronic narcotic pain medications, smoking, osteopenia, glaucoma, hyperlipidemia, hypertension, fibromyalgia, laryngitis, hypothyroidism, on numerous medications including tizanidine, vitamin D, Zetia, methadone, oxycodone, losartan, Zyrtec, gabapentin, Flonase, Enbrel, Celebrex, prednisone (prn), Synthroid, and Lipitor, hx of left bundle-branch block, history of syncope and near syncope, status post loop recorder  placement, and obesity, who presents for follow-up consultation of her recurrent headaches. The patient is unaccompanied today. I first met her on 06/29/2015 at the request of her primary care physician, at which time she reported recurrent headaches for the past 2-3 months, some blurry vision, some nausea, no vomiting. She reported a remote history of migraines in her teenage years. I suggested a brain MRI with and without contrast and an EEG. I did not suggest any additional medications as she was on multiple medications at the time. She had a brain MRI with and without contrast on 07/08/2015: IMPRESSION:   Mildly abnormal MRI brain (with and without) demonstrating: 1. Scattered periventricular and subcortical punctate T2 hyperintensities. No abnormal lesions are seen on post contrast views. Likely represents mild chronic small vessel ischemic disease.   2. No acute findings. 3. No change from MRI on 05/31/14.  We called her with her test results. She had an EEG on 06/30/2015: Impression: This is a normal EEG recording in the waking state. No evidence of ictal or interictal discharges are seen. This is at times technically difficult secondary to electrode  artifact. We called her with her test results.  Today, 10/01/2015: She reports doing a little better, had blood work with Dr. Shelia Media 07/29/15, which I reviewed. Her PCP referred her to endocrinologist, and she saw Dr. Chalmers Cater, had more blood work. Was told, there may be a mild dysfunction of the pituitary glad, but is supposed to be observed, from what I understand. Also, it sounds like she had a glucose tolerance test and failed it and was told she has diabetes.    Previously:  06/29/2015: She reports recurrent headaches for the past 2-3 months, associated with lethargy, blurry vision, facial swelling on both sides. She has bifrontal HAs and it may radiate to the top and back, b/l, associated with nausea, no vomiting.   She had migraine in her teenage years.   This HAs is worse as in more constant with more nausea.   She had a CT Head wo contrast on 02/28/11 for indication migraine:  IMPRESSION: No acute intracranial abnormalities.    She was no chronic prednisone some 15 years ago, now only when needed, last time used was months ago.  For her blurry vision she has seen ophthalmology a few months ago, and was told that her symptoms were not related to her glaucoma. I reviewed your office note from 06/15/18 17, which you kindly included. Recent blood work through your office included a CMP on 05/21/2015, which was unremarkable. She had a sleep study some 6-7 years ago and states, she did not have OSA then. She has gained weight in the last 3 months, in the realm of 28 lb.   She had a positive ANA recently, with normal ESR. She sees a rheumatologist.   She smokes cigarettes, about 4-10 per day. She does not  drink alcohol.   BP goes up and down. She drinks caffeine quite a bit: 4-6 cups of coffee and 2 cans of Cola.   She has nocturia up to 4 times a night, worse lately. She lives alone. She has 2 grown children. Her 27 year old granddaughter stays over often but has her own bedroom. She has noted  snoring.  She is the youngest of 87 children, one brother had brain cancer, one had a brain aneurysm. One brother had mesothelioma.   No OSA, no Migraine in FHx.   Her Past Medical History Is Significant For: Past Medical History  Diagnosis Date  . Psoriasis   . Chronic back pain   . Hypertension   . Hypercholesteremia   . Osteopenia   . Hypothyroid   . Fibromyalgia   . Addiction, opium ([place])   . GERD (gastroesophageal reflux disease)   . Laryngitis   . LBBB (left bundle branch block)   . Glaucoma   . Osteopenia   . Hyperlipemia   . Hypokalemia   . Sjogren's syndrome ([place])   . Arthritis     Her Past Surgical History Is Significant For: Past Surgical History  Procedure Laterality Date  . Cesarean section    . Appendectomy    . Laparoscopic ovarian cystectomy    . Tonsillectomy    . Cardiac catheterization  06/18/2002    normal  . US echocardiography  08/05/2010    normal  . Nm myocar perf wall motion  07/17/2009    fixed anteroseptal defect,no ischemia  . Loop recorder implant N/A 04/22/2014    Procedure: LOOP RECORDER IMPLANT;  Surgeon: Sanda Klein, MD;  Location: [facility];  Service: Cardiovascular;  Laterality: N/A;    Her Family History Is Significant For: Family History  Problem Relation Age of Onset  . Hypertension Mother   . Heart failure Mother   . Diabetes Mother   . Diabetes Father   . Heart failure Father   . Hypertension Father   . Cancer Brother     brain  . Cancer Brother     lung  . Hypertension Brother   . Hypertension Sister     Her Social History Is Significant For: Social History   Social History  . Marital Status: Divorced    Spouse Name: N/A  . Number of Children: 2  . Years of Education: N/A   Occupational History  . Disabled     Social History Main Topics  . Smoking status: Current Every Day Smoker -- 1.00 packs/day  . Smokeless tobacco: None  . Alcohol Use: No  . Drug Use: No  . Sexual Activity: Not Asked   Other  Topics Concern  . None   Social History Narrative   Drinks 4-6 cups of coffee a day and 2 sodas     Her Allergies Are:  Allergies  Allergen Reactions  . Adalimumab Nausea And Vomiting and Other (See Comments)    Headache,   . Arava [Leflunomide] Other (See Comments)    Heart races   . Aspirin Other (See Comments)    Upset stomach, burning sensation.  . Codeine Nausea And Vomiting and Other (See Comments)    Dizzy,heart speeds up  . Darvocet [Propoxyphene N-Acetaminophen] Other (See Comments)    Heart races  . Fioricet [Butalbital-Apap-Caffeine] Other (See Comments)    Heart races.  . Levofloxacin Nausea And Vomiting  . Sertraline Hcl Other (See Comments)    Patient states "like a movie going on and can  see killing someone else"  . Sulfa Antibiotics Diarrhea and Other (See Comments)    Stomach upset  :   Her Current Medications Are:  Outpatient Encounter Prescriptions as of 10/01/2015  Medication Sig  . albuterol (PROAIR HFA) 108 (90 BASE) MCG/ACT inhaler Inhale 2 puffs into the lungs every 6 (six) hours as needed for wheezing or shortness of breath.  Marland Kitchen atorvastatin (LIPITOR) 40 MG tablet Take 40 mg by mouth daily.  . celecoxib (CELEBREX) 200 MG capsule Take 200 mg by mouth daily.    . cetirizine (ZYRTEC) 10 MG tablet Take 10 mg by mouth daily as needed for allergies.   . Cholecalciferol (VITAMIN D) 2000 UNITS tablet Take 2,000 Units by mouth 2 (two) times daily.  . Cinnamon 500 MG capsule Take 500 mg by mouth 2 (two) times daily.  . diclofenac sodium (VOLTAREN) 1 % GEL Apply 1 application topically 3 (three) times daily as needed (pain). On hands and knees  . etanercept (ENBREL) 50 MG/ML injection Inject 50 mg into the skin 2 (two) times a week. Tuesday and Friday.  . ezetimibe (ZETIA) 10 MG tablet Take 5 mg by mouth daily.  . fluticasone (FLONASE) 50 MCG/ACT nasal spray Place into both nostrils daily.  Marland Kitchen gabapentin (NEURONTIN) 600 MG tablet Take 600 mg by mouth 4 (four)  times daily.  Marland Kitchen levothyroxine (SYNTHROID, LEVOTHROID) 112 MCG tablet Take 112 mcg by mouth daily before breakfast.   . lidocaine (LIDODERM) 5 % Place 1 patch onto the skin daily. Remove & Discard patch within 12 hours or as directed by MD  . losartan-hydrochlorothiazide (HYZAAR) 100-25 MG per tablet Take 1 tablet by mouth daily.  . methadone (DOLOPHINE) 10 MG tablet Take 10 mg by mouth 4 (four) times daily.   . Omega-3 Fatty Acids (FISH OIL) 1000 MG CAPS Take 1,000 mg by mouth 2 (two) times daily.  Marland Kitchen oxyCODONE-acetaminophen (ENDOCET) 10-325 MG per tablet Take 1 tablet by mouth 3 (three) times daily as needed for pain.  Marland Kitchen tiZANidine (ZANAFLEX) 4 MG tablet Take 4 mg by mouth 4 (four) times daily.    No facility-administered encounter medications on file as of 10/01/2015.  :  Review of Systems:  Out of a complete 14 point review of systems, all are reviewed and negative with the exception of these symptoms as listed below:   Review of Systems  Neurological:       Patient states that she is doing a little better than when we last saw her. Still has some facial swelling.  Patient would like to discuss recent blood work done at Dr. Pennie Banter office and the MRI that we did here.     Objective:  Neurologic Exam  Physical Exam Physical Examination:   Filed Vitals:   10/01/15 0829  BP: 132/70  Pulse: 72  Resp: 16   General Examination: The patient is a very pleasant 60 y.o. female in no acute distress. She appears well-developed and well-nourished and well groomed. She is obese. Has gained weight.   HEENT: Normocephalic, atraumatic, pupils are equal, round and reactive to light and accommodation. Extraocular tracking is good without limitation to gaze excursion or nystagmus noted. Normal smooth pursuit is noted. Hearing is grossly intact. Face is symmetric with normal facial animation and normal facial sensation. Speech is clear with no dysarthria noted. There is no hypophonia. There is no lip,  neck/head, jaw or voice tremor. Neck is supple with full range of passive and active motion. There are no carotid bruits on  auscultation. Oropharynx exam reveals: moderate mouth dryness, adequate dental hygiene and moderate airway crowding, due to thicker soft palate and larger uvula. Mallampati is class II. Tongue protrudes centrally and palate elevates symmetrically. Tonsils are absent.   Chest: Clear to auscultation without wheezing, rhonchi or crackles noted.  Heart: S1+S2+0, regular and normal without murmurs, rubs or gallops noted.   Abdomen: Soft, non-tender and non-distended with normal bowel sounds appreciated on auscultation.  Extremities: There is no pitting edema in the distal lower extremities bilaterally. Pedal pulses are intact.  Skin: Warm and dry without trophic changes noted. There are no varicose veins.  Musculoskeletal: exam reveals prominent joint deformities in hands and feet, L knee slightly larger.   Neurologically:  Mental status: The patient is awake, alert and oriented in all 4 spheres. Her immediate and remote memory, attention, language skills and fund of knowledge are appropriate. There is no evidence of aphasia, agnosia, apraxia or anomia. Speech is clear with normal prosody and enunciation. Thought process is linear. Mood is normal and affect is normal.  Cranial nerves II - XII are as described above under HEENT exam. In addition: shoulder shrug is normal with equal shoulder height noted. Motor exam: Normal bulk, strength and tone is noted. There is no drift, tremor or rebound. Romberg is negative. Reflexes are 1+ throughout, except trace in the ankles. Babinski: Toes are flexor bilaterally. Fine motor skills and coordination: intact with normal finger taps, normal hand movements, normal rapid alternating patting, normal foot taps and normal foot agility.  Cerebellar testing: No dysmetria or intention tremor. There is no truncal or gait ataxia.  Sensory exam: intact  to light touch, vibration, temperature sense in the upper and lower extremities.  Gait, station and balance: She stands easily. No veering to one side is noted. No leaning to one side is noted. Posture is age-appropriate and stance is narrow based. Gait shows normal stride length and normal pace. No problems turning are noted.  Assessment and Plan:  In summary, [name] is a very pleasant 60 year old female with an underlying complicated medical history of psoriasis, psoriatic arthritis, Sjogren's syndrome, carotid stenosis, reflux disease, low back pain wth history of lumbar spinal stenosis reported, on chronic narcotic pain medications, smoking, osteopenia, glaucoma, hyperlipidemia, hypertension, fibromyalgia, laryngitis, hypothyroidism, on numerous medications including tizanidine, vitamin D, Zetia, methadone, oxycodone, losartan, Zyrtec, gabapentin, Flonase, Enbrel, Celebrex, prednisone (prn), Synthroid, and Lipitor, hx of left bundle-branch block, history of syncope and near syncope, status post loop recorder  placement, and obesity, who presents for follow up consultation of her recurrent headaches. Her history is not telltale for migrainous headaches or any particular headache syndromes such as cluster headache. She had daily headaches, but Thankfully, she feels improved. Work up from my end of things included brain MRI with and without contrast and an EEG, both of which were reassuring. Her physical exam and neurological exam are stable. We talked about headache triggers. We talked about maintaining a healthy lifestyle and staying active mentally and physically and smoking cessation. She has reduced her smoking and is commended for this. At this juncture I suggested an as needed follow-up. I answered all her questions today and she was in agreement. I spent 25 minutes in total face-to-face time with the patient, more than 50% of which was spent in counseling and coordination of care, reviewing  test results, reviewing medication and discussing or reviewing the diagnosis of recurrent HAs, the prognosis and treatment options.

## 2015-10-01 NOTE — Patient Instructions (Signed)
I am glad to see you feeling better! MRI brain and EEG with [place] were reassuring! I can see you back as needed at this point.

## 2015-10-15 ENCOUNTER — Ambulatory Visit (INDEPENDENT_AMBULATORY_CARE_PROVIDER_SITE_OTHER): Payer: Medicare Other | Admitting: *Deleted

## 2015-10-15 DIAGNOSIS — R55 Syncope and collapse: Secondary | ICD-10-CM

## 2015-10-15 NOTE — Progress Notes (Signed)
Carelink Summary Report / Loop Recorder 

## 2015-10-19 DIAGNOSIS — Z79899 Other long term (current) drug therapy: Secondary | ICD-10-CM | POA: Diagnosis not present

## 2015-10-19 DIAGNOSIS — L4052 Psoriatic arthritis mutilans: Secondary | ICD-10-CM | POA: Diagnosis not present

## 2015-10-19 DIAGNOSIS — M2022 Hallux rigidus, left foot: Secondary | ICD-10-CM | POA: Diagnosis not present

## 2015-10-19 DIAGNOSIS — M2021 Hallux rigidus, right foot: Secondary | ICD-10-CM | POA: Diagnosis not present

## 2015-10-19 DIAGNOSIS — R768 Other specified abnormal immunological findings in serum: Secondary | ICD-10-CM | POA: Diagnosis not present

## 2015-10-19 DIAGNOSIS — M150 Primary generalized (osteo)arthritis: Secondary | ICD-10-CM | POA: Diagnosis not present

## 2015-10-19 DIAGNOSIS — L4050 Arthropathic psoriasis, unspecified: Secondary | ICD-10-CM | POA: Diagnosis not present

## 2015-10-20 LAB — CUP PACEART REMOTE DEVICE CHECK: MDC IDC SESS DTM: 20170606050925

## 2015-10-26 DIAGNOSIS — Z5181 Encounter for therapeutic drug level monitoring: Secondary | ICD-10-CM | POA: Diagnosis not present

## 2015-10-26 DIAGNOSIS — M4802 Spinal stenosis, cervical region: Secondary | ICD-10-CM | POA: Diagnosis not present

## 2015-10-26 DIAGNOSIS — L4050 Arthropathic psoriasis, unspecified: Secondary | ICD-10-CM | POA: Diagnosis not present

## 2015-10-26 DIAGNOSIS — M1288 Other specific arthropathies, not elsewhere classified, other specified site: Secondary | ICD-10-CM | POA: Diagnosis not present

## 2015-10-26 DIAGNOSIS — M5416 Radiculopathy, lumbar region: Secondary | ICD-10-CM | POA: Diagnosis not present

## 2015-10-26 DIAGNOSIS — M791 Myalgia: Secondary | ICD-10-CM | POA: Diagnosis not present

## 2015-10-29 LAB — CUP PACEART REMOTE DEVICE CHECK: Date Time Interrogation Session: 20170706060917

## 2015-11-16 ENCOUNTER — Ambulatory Visit (INDEPENDENT_AMBULATORY_CARE_PROVIDER_SITE_OTHER): Payer: Medicare Other | Admitting: *Deleted

## 2015-11-16 DIAGNOSIS — R55 Syncope and collapse: Secondary | ICD-10-CM | POA: Diagnosis not present

## 2015-11-16 NOTE — Progress Notes (Signed)
Carelink Summary Report / Loop Recorder 

## 2015-11-23 LAB — CUP PACEART REMOTE DEVICE CHECK: MDC IDC SESS DTM: 20170805063926

## 2015-12-15 ENCOUNTER — Ambulatory Visit (INDEPENDENT_AMBULATORY_CARE_PROVIDER_SITE_OTHER): Payer: Medicare Other | Admitting: *Deleted

## 2015-12-15 DIAGNOSIS — R55 Syncope and collapse: Secondary | ICD-10-CM

## 2015-12-15 DIAGNOSIS — Z79899 Other long term (current) drug therapy: Secondary | ICD-10-CM | POA: Diagnosis not present

## 2015-12-15 DIAGNOSIS — M50322 Other cervical disc degeneration at C5-C6 level: Secondary | ICD-10-CM | POA: Diagnosis not present

## 2015-12-15 DIAGNOSIS — M150 Primary generalized (osteo)arthritis: Secondary | ICD-10-CM | POA: Diagnosis not present

## 2015-12-15 DIAGNOSIS — L4050 Arthropathic psoriasis, unspecified: Secondary | ICD-10-CM | POA: Diagnosis not present

## 2015-12-15 DIAGNOSIS — M25511 Pain in right shoulder: Secondary | ICD-10-CM | POA: Diagnosis not present

## 2015-12-15 DIAGNOSIS — R768 Other specified abnormal immunological findings in serum: Secondary | ICD-10-CM | POA: Diagnosis not present

## 2015-12-15 NOTE — Progress Notes (Signed)
Carelink Summary Report / Loop Recorder 

## 2015-12-17 DIAGNOSIS — M533 Sacrococcygeal disorders, not elsewhere classified: Secondary | ICD-10-CM | POA: Diagnosis not present

## 2015-12-17 DIAGNOSIS — M4802 Spinal stenosis, cervical region: Secondary | ICD-10-CM | POA: Diagnosis not present

## 2015-12-17 DIAGNOSIS — L4050 Arthropathic psoriasis, unspecified: Secondary | ICD-10-CM | POA: Diagnosis not present

## 2015-12-17 DIAGNOSIS — M1288 Other specific arthropathies, not elsewhere classified, other specified site: Secondary | ICD-10-CM | POA: Diagnosis not present

## 2015-12-22 ENCOUNTER — Encounter: Payer: Self-pay | Admitting: Cardiovascular Disease

## 2015-12-22 ENCOUNTER — Ambulatory Visit (INDEPENDENT_AMBULATORY_CARE_PROVIDER_SITE_OTHER): Payer: Medicare Other | Admitting: Cardiovascular Disease

## 2015-12-22 VITALS — BP 156/90 | HR 62 | Ht 63.0 in | Wt 183.8 lb

## 2015-12-22 DIAGNOSIS — I10 Essential (primary) hypertension: Secondary | ICD-10-CM

## 2015-12-22 DIAGNOSIS — Z95818 Presence of other cardiac implants and grafts: Secondary | ICD-10-CM

## 2015-12-22 DIAGNOSIS — R55 Syncope and collapse: Secondary | ICD-10-CM | POA: Diagnosis not present

## 2015-12-22 DIAGNOSIS — I708 Atherosclerosis of other arteries: Secondary | ICD-10-CM | POA: Diagnosis not present

## 2015-12-22 DIAGNOSIS — I447 Left bundle-branch block, unspecified: Secondary | ICD-10-CM

## 2015-12-22 DIAGNOSIS — E785 Hyperlipidemia, unspecified: Secondary | ICD-10-CM

## 2015-12-22 DIAGNOSIS — I771 Stricture of artery: Secondary | ICD-10-CM

## 2015-12-22 DIAGNOSIS — Z720 Tobacco use: Secondary | ICD-10-CM

## 2015-12-22 HISTORY — DX: Stricture of artery: I77.1

## 2015-12-22 NOTE — Patient Instructions (Signed)
Dr Croitoru recommends that you schedule a follow-up appointment in 1 year. You will receive a reminder letter in the mail two months in advance. If you don't receive a letter, please call our office to schedule the follow-up appointment.  If you need a refill on your cardiac medications before your next appointment, please call your pharmacy. 

## 2015-12-22 NOTE — Progress Notes (Signed)
Cardiology Office Note    Date:  12/22/2015   ID:  [name], DOB [birth year], MRN [id number]  PCP:  Horatio Pel, MD  Cardiologist:   Sanda Klein, MD   Chief Complaint  Patient presents with  . Follow-up    lightheaded; randomly. edema; in abdominal and legs.    History of Present Illness:  [name] is a 60 y.o. female chronic left bundle branch block and new early mediated syncope. She has an implantable loop recorder in place since January 2016, which has not yet shown any significant arrhythmia.  He has not had any cardiac or vascular symptoms since the last office appointment. She continues have a lot of articular issues. She has frequent pain in her left neck and left shoulder and has required to spinal injections. She takes Celebrex twice daily and this has caused a lot of problems with swelling. She has been on numerous courses of prednisone this year, both for her arthritis and for allergic problems. Denies angina, dyspnea, palpitations, near-syncope or syncope, dizziness or anything more than mild ankle edema. No neurological focal complaints.  She has gained weight this year and is in the mildly obese range. Unfortunate she continues to smoke, but has cut back to 5-12 cigarettes per day. She will have labs performed that her physical with Dr. Shelia Media on October 11.  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 She also has a long-standing left bundle branch block which has been documented at least since 2007. She had a normal echocardiogram in 2012. Normal nuclear stress test in 2011. Minimal plaque in the carotids by ultrasonography in 2011 and December 2014. She has moderate right subclavian artery stenosis. The ultrasound performed in 2014 showed a stable 50-69 % in the right subclavian artery. The blood pressure gradient is 10-20 mmHg. The peak velocity in the subclavian artery  was 325 cm/s (contralateral 243 cm/s).  Past Medical History:  Diagnosis Date  . Addiction, opium ([place])   . Arthritis   . Chronic back pain   . Fibromyalgia   . GERD (gastroesophageal reflux disease)   . Glaucoma   . Hypercholesteremia   . Hyperlipemia   . Hypertension   . Hypokalemia   . Hypothyroid   . Laryngitis   . LBBB (left bundle branch block)   . Osteopenia   . Osteopenia   . Psoriasis   . Sjogren's syndrome ([place])   . Stenosis of right subclavian artery 12/22/2015    Past Surgical History:  Procedure Laterality Date  . APPENDECTOMY    . CARDIAC CATHETERIZATION  06/18/2002   normal  . CESAREAN SECTION    . LAPAROSCOPIC OVARIAN CYSTECTOMY    . LOOP RECORDER IMPLANT N/A 04/22/2014   Procedure: LOOP RECORDER IMPLANT;  Surgeon: Sanda Klein, MD;  Location: [facility];  Service: Cardiovascular;  Laterality: N/A;  . NM MYOCAR PERF WALL MOTION  07/17/2009   fixed anteroseptal defect,no ischemia  . TONSILLECTOMY    . US ECHOCARDIOGRAPHY  08/05/2010   normal    Current Medications: Outpatient Medications Prior to Visit  Medication Sig Dispense Refill  . albuterol (PROAIR HFA) 108 (90 BASE) MCG/ACT inhaler Inhale 2 puffs into the lungs every 6 (six) hours as needed for wheezing or shortness of breath.    Marland Kitchen atorvastatin (LIPITOR) 40 MG tablet Take 40 mg by mouth daily.    . celecoxib (CELEBREX) 200 MG capsule Take 200 mg by mouth  2 (two) times daily.     . cetirizine (ZYRTEC) 10 MG tablet Take 10 mg by mouth daily as needed for allergies.   11  . Cholecalciferol (VITAMIN D) 2000 UNITS tablet Take 2,000 Units by mouth 2 (two) times daily.    . Cinnamon 500 MG capsule Take 500 mg by mouth 2 (two) times daily.    . diclofenac sodium (VOLTAREN) 1 % GEL Apply 1 application topically 3 (three) times daily as needed (pain). On hands and knees    . etanercept (ENBREL) 50 MG/ML injection Inject 50 mg into the skin 2 (two) times a week. Tuesday and Friday.    . ezetimibe (ZETIA) 10  MG tablet Take 5 mg by mouth daily.    . fluticasone (FLONASE) 50 MCG/ACT nasal spray Place into both nostrils daily.    Marland Kitchen gabapentin (NEURONTIN) 600 MG tablet Take 600 mg by mouth 4 (four) times daily.  5  . levothyroxine (SYNTHROID, LEVOTHROID) 112 MCG tablet Take 112 mcg by mouth daily before breakfast.     . lidocaine (LIDODERM) 5 % Place 1 patch onto the skin daily. Remove & Discard patch within 12 hours or as directed by MD    . losartan-hydrochlorothiazide (HYZAAR) 100-25 MG per tablet Take 1 tablet by mouth daily.    . Omega-3 Fatty Acids (FISH OIL) 1000 MG CAPS Take 1,000 mg by mouth 2 (two) times daily.    Marland Kitchen oxyCODONE-acetaminophen (ENDOCET) 10-325 MG per tablet Take 1 tablet by mouth 3 (three) times daily as needed for pain.    Marland Kitchen tiZANidine (ZANAFLEX) 4 MG tablet Take 4 mg by mouth 4 (four) times daily.     . methadone (DOLOPHINE) 10 MG tablet Take 10 mg by mouth 4 (four) times daily.      No facility-administered medications prior to visit.      Allergies:   Adalimumab; Arava [leflunomide]; Aspirin; Codeine; Darvocet [propoxyphene n-acetaminophen]; Fioricet [butalbital-apap-caffeine]; Levofloxacin; Sertraline hcl; and Sulfa antibiotics   Social History   Social History  . Marital status: Divorced    Spouse name: N/A  . Number of children: 2  . Years of education: N/A   Occupational History  . Disabled     Social History Main Topics  . Smoking status: Current Every Day Smoker    Packs/day: 1.00  . Smokeless tobacco: None  . Alcohol use No  . Drug use: No  . Sexual activity: Not Asked   Other Topics Concern  . None   Social History Narrative   Drinks 4-6 cups of coffee a day and 2 sodas      Family History:  The patient's family history includes Cancer in her brother and brother; Diabetes in her father and mother; Heart failure in her father and mother; Hypertension in her brother, father, mother, and sister.   ROS:   Please see the history of present illness.     ROS All other systems reviewed and are negative.   PHYSICAL EXAM:   VS:  BP (!) 156/90   Pulse 62   Ht 5\' 3"  (1.6 m)   Wt 183 lb 12.8 oz (83.4 kg)   BMI 32.56 kg/m     On my exam: right arm blood pressure 140/78 mmHg, left arm blood pressure 161/90 mmHg  GEN: Well nourished, well developed, in no acute distress  HEENT: normal  Neck: no JVD, carotid bruits, or masses Cardiac: Paradoxically split S2 , RRR; no murmurs, rubs, or gallops,no edema  Respiratory:  clear to auscultation bilaterally,  normal work of breathing GI: soft, nontender, nondistended, + BS MS: no deformity or atrophy  Skin: warm and dry, no rash Neuro:  Alert and Oriented x 3, Strength and sensation are intact Psych: euthymic mood, full affect  Wt Readings from Last 3 Encounters:  12/22/15 183 lb 12.8 oz (83.4 kg)  10/01/15 182 lb (82.6 kg)  06/29/15 179 lb (81.2 kg)      Studies/Labs Reviewed:   EKG:  EKG is ordered today.  The ekg ordered today demonstrates Normal sinus rhythm, left bundle branch block QRS 148 ms, QTC 462 ms  Recent Labs: No results found for requested labs within last 8760 hours.   Lipid Panel    Component Value Date/Time   CHOL  08/14/2008 0210    139        ATP III CLASSIFICATION:  <200     mg/dL   Desirable  200-239  mg/dL   Borderline High  >=240    mg/dL   High          TRIG 125 08/14/2008 0210   HDL 28 (L) 08/14/2008 0210   CHOLHDL 5.0 08/14/2008 0210   VLDL 25 08/14/2008 0210   LDLCALC  08/14/2008 0210    86        Total Cholesterol/HDL:CHD Risk Coronary Heart Disease Risk Table                     Men   Women  1/2 Average Risk   3.4   3.3  Average Risk       5.0   4.4  2 X Average Risk   9.6   7.1  3 X Average Risk  23.4   11.0        Use the calculated Patient Ratio above and the CHD Risk Table to determine the patient's CHD Risk.        ATP III CLASSIFICATION (LDL):  <100     mg/dL   Optimal  100-129  mg/dL   Near or Above                    Optimal   130-159  mg/dL   Borderline  160-189  mg/dL   High  >190     mg/dL   Very High      ASSESSMENT:    1. Near syncope   2. LBBB (left bundle branch block)   3. Stenosis of right subclavian artery   4. Hyperlipidemia   5. Essential hypertension   6. Tobacco abuse   7. Status post placement of implantable loop recorder      PLAN:  In order of problems listed above:  1. Syncope: From a history point of view, her episodes of previous near syncope sounded like neurally mediated events. None have occurred since implantation of her loop recorder. She has had some mild prodromal symptoms, without any visible arrhythmia on the loop recorder. 2. LBBB: No evidence of high-grade AV block by ECG or loop recorder 3. Right subclavian stenosis: The difference in blood pressure between the right and left upper extremity is a little higher now at about 20 mmHg. She does not have any symptoms of claudication. Reminded to always check her blood pressure in the left arm. 4. HLP: Labs to be checked next month with her primary care provider. Target LDL less than 100, preferably less than 70. Previous workup without evidence for coronary disease. 5. HTN: Blood pressure is high today, but she reports  that at home it is typically in the 120s/80 range. Just recently when she went to pain management clinic it was 125/78. She felt very anxious driving through the rainy streets today. No changes made to her medications today. 6. Tobacco abuse: Strongly encouraged to completely quit smoking. 7. ILR: Discussed management of loop recorder once the battery decays. It sounds like she would prefer to leave it in place    Medication Adjustments/Labs and Tests Ordered: Current medicines are reviewed at length with the patient today.  Concerns regarding medicines are outlined above.  Medication changes, Labs and Tests ordered today are listed in the Patient Instructions below. Patient Instructions  Dr Sallyanne Kuster recommends  that you schedule a follow-up appointment in 1 year. You will receive a reminder letter in the mail two months in advance. If you don't receive a letter, please call our office to schedule the follow-up appointment.  If you need a refill on your cardiac medications before your next appointment, please call your pharmacy.    Signed, Sanda Klein, MD  12/22/2015 8:54 AM    [facility] [address], [place],   [address] Phone: [phone]; Fax: [phone]

## 2015-12-28 ENCOUNTER — Telehealth: Payer: Self-pay | Admitting: Cardiovascular Disease

## 2015-12-28 NOTE — Telephone Encounter (Signed)
Transmission received 12/27/15- no new episodes, nothing detected Saturday 12/26/15. Spoke to Ms. [name]. She reports that she was at home and felt very weak and dizzy, but felt better if she was moving around and not resting. She reports that her legs felt like they would "give way if she gave up". She felt like she was "blown up in my stomach and chest" and denies palpitations. She showered and tried to steady her breathing and says the symptoms were relieved in 2 hours. I instructed her to call 911 if she has another episode and it is not relieved. She has the device clinic phone number if she has further concerns/ questions.

## 2015-12-28 NOTE — Telephone Encounter (Signed)
[name] is calling because she had an episode w/ her heart on Saturday and Is needing to know if anything from her loop recorder came thru to see what may have been going on . Please call   Thanks

## 2016-01-09 LAB — CUP PACEART REMOTE DEVICE CHECK: Date Time Interrogation Session: 20170904070825

## 2016-01-09 NOTE — Progress Notes (Signed)
Carelink summary report received. Battery status OK. Normal device function. No new symptom episodes, tachy episodes, brady, or pause episodes. No new AF episodes. Monthly summary reports and ROV/PRN 

## 2016-01-13 ENCOUNTER — Ambulatory Visit (INDEPENDENT_AMBULATORY_CARE_PROVIDER_SITE_OTHER): Payer: Medicare Other | Admitting: *Deleted

## 2016-01-13 DIAGNOSIS — R55 Syncope and collapse: Secondary | ICD-10-CM | POA: Diagnosis not present

## 2016-01-13 NOTE — Progress Notes (Signed)
Carelink Summary Report / Loop Recorder 

## 2016-01-20 DIAGNOSIS — E559 Vitamin D deficiency, unspecified: Secondary | ICD-10-CM | POA: Diagnosis not present

## 2016-01-20 DIAGNOSIS — E7800 Pure hypercholesterolemia, unspecified: Secondary | ICD-10-CM | POA: Diagnosis not present

## 2016-01-20 DIAGNOSIS — I10 Essential (primary) hypertension: Secondary | ICD-10-CM | POA: Diagnosis not present

## 2016-01-27 DIAGNOSIS — E119 Type 2 diabetes mellitus without complications: Secondary | ICD-10-CM | POA: Diagnosis not present

## 2016-01-27 DIAGNOSIS — Z23 Encounter for immunization: Secondary | ICD-10-CM | POA: Diagnosis not present

## 2016-01-27 DIAGNOSIS — K219 Gastro-esophageal reflux disease without esophagitis: Secondary | ICD-10-CM | POA: Diagnosis not present

## 2016-01-27 DIAGNOSIS — I10 Essential (primary) hypertension: Secondary | ICD-10-CM | POA: Diagnosis not present

## 2016-01-27 DIAGNOSIS — E039 Hypothyroidism, unspecified: Secondary | ICD-10-CM | POA: Diagnosis not present

## 2016-01-27 DIAGNOSIS — E7800 Pure hypercholesterolemia, unspecified: Secondary | ICD-10-CM | POA: Diagnosis not present

## 2016-01-27 DIAGNOSIS — I700 Atherosclerosis of aorta: Secondary | ICD-10-CM | POA: Diagnosis not present

## 2016-02-12 ENCOUNTER — Ambulatory Visit (INDEPENDENT_AMBULATORY_CARE_PROVIDER_SITE_OTHER): Payer: Medicare Other | Admitting: *Deleted

## 2016-02-12 DIAGNOSIS — R55 Syncope and collapse: Secondary | ICD-10-CM

## 2016-02-12 NOTE — Progress Notes (Signed)
Carelink Summary Report / Loop Recorder 

## 2016-02-16 DIAGNOSIS — Z1231 Encounter for screening mammogram for malignant neoplasm of breast: Secondary | ICD-10-CM | POA: Diagnosis not present

## 2016-02-21 LAB — CUP PACEART REMOTE DEVICE CHECK
Implantable Pulse Generator Implant Date: 20160112
MDC IDC SESS DTM: 20171004083813

## 2016-02-21 NOTE — Progress Notes (Signed)
Carelink summary report received. Battery status OK. Normal device function. No new tachy episodes, brady, or pause episodes. No new AF episodes. 1 symptom episode, SR. Monthly summary reports and ROV/PRN

## 2016-02-23 DIAGNOSIS — H01002 Unspecified blepharitis right lower eyelid: Secondary | ICD-10-CM | POA: Diagnosis not present

## 2016-02-25 DIAGNOSIS — M791 Myalgia: Secondary | ICD-10-CM | POA: Diagnosis not present

## 2016-02-25 DIAGNOSIS — L4050 Arthropathic psoriasis, unspecified: Secondary | ICD-10-CM | POA: Diagnosis not present

## 2016-02-25 DIAGNOSIS — M533 Sacrococcygeal disorders, not elsewhere classified: Secondary | ICD-10-CM | POA: Diagnosis not present

## 2016-02-25 DIAGNOSIS — M1288 Other specific arthropathies, not elsewhere classified, other specified site: Secondary | ICD-10-CM | POA: Diagnosis not present

## 2016-02-25 DIAGNOSIS — M4802 Spinal stenosis, cervical region: Secondary | ICD-10-CM | POA: Diagnosis not present

## 2016-02-29 DIAGNOSIS — Z1212 Encounter for screening for malignant neoplasm of rectum: Secondary | ICD-10-CM | POA: Diagnosis not present

## 2016-02-29 DIAGNOSIS — Z1211 Encounter for screening for malignant neoplasm of colon: Secondary | ICD-10-CM | POA: Diagnosis not present

## 2016-03-09 DIAGNOSIS — I10 Essential (primary) hypertension: Secondary | ICD-10-CM | POA: Diagnosis not present

## 2016-03-09 DIAGNOSIS — E119 Type 2 diabetes mellitus without complications: Secondary | ICD-10-CM | POA: Diagnosis not present

## 2016-03-09 DIAGNOSIS — J309 Allergic rhinitis, unspecified: Secondary | ICD-10-CM | POA: Diagnosis not present

## 2016-03-14 ENCOUNTER — Ambulatory Visit (INDEPENDENT_AMBULATORY_CARE_PROVIDER_SITE_OTHER): Payer: Medicare Other | Admitting: *Deleted

## 2016-03-14 DIAGNOSIS — R55 Syncope and collapse: Secondary | ICD-10-CM | POA: Diagnosis not present

## 2016-03-14 NOTE — Progress Notes (Signed)
Carelink Summary Report / Loop Recorder 

## 2016-03-15 DIAGNOSIS — R768 Other specified abnormal immunological findings in serum: Secondary | ICD-10-CM | POA: Diagnosis not present

## 2016-03-15 DIAGNOSIS — M150 Primary generalized (osteo)arthritis: Secondary | ICD-10-CM | POA: Diagnosis not present

## 2016-03-15 DIAGNOSIS — Z79899 Other long term (current) drug therapy: Secondary | ICD-10-CM | POA: Diagnosis not present

## 2016-03-15 DIAGNOSIS — L4050 Arthropathic psoriasis, unspecified: Secondary | ICD-10-CM | POA: Diagnosis not present

## 2016-03-27 LAB — CUP PACEART REMOTE DEVICE CHECK
MDC IDC PG IMPLANT DT: 20160112
MDC IDC SESS DTM: 20171103090827

## 2016-03-27 NOTE — Progress Notes (Signed)
Carelink summary report received. Battery status OK. Normal device function. No new symptom episodes, tachy episodes, brady, or pause episodes. No new AF episodes. Monthly summary reports and ROV/PRN 

## 2016-04-12 ENCOUNTER — Ambulatory Visit (INDEPENDENT_AMBULATORY_CARE_PROVIDER_SITE_OTHER): Payer: Medicare Other | Admitting: *Deleted

## 2016-04-12 DIAGNOSIS — R55 Syncope and collapse: Secondary | ICD-10-CM | POA: Diagnosis not present

## 2016-04-13 NOTE — Progress Notes (Signed)
Carelink Summary Report 

## 2016-04-22 DIAGNOSIS — M533 Sacrococcygeal disorders, not elsewhere classified: Secondary | ICD-10-CM | POA: Diagnosis not present

## 2016-04-22 DIAGNOSIS — M1288 Other specific arthropathies, not elsewhere classified, other specified site: Secondary | ICD-10-CM | POA: Diagnosis not present

## 2016-04-22 DIAGNOSIS — M4802 Spinal stenosis, cervical region: Secondary | ICD-10-CM | POA: Diagnosis not present

## 2016-04-22 DIAGNOSIS — L4050 Arthropathic psoriasis, unspecified: Secondary | ICD-10-CM | POA: Diagnosis not present

## 2016-04-29 ENCOUNTER — Other Ambulatory Visit: Payer: Self-pay | Admitting: Acute Care

## 2016-04-29 DIAGNOSIS — F17210 Nicotine dependence, cigarettes, uncomplicated: Secondary | ICD-10-CM

## 2016-05-04 ENCOUNTER — Encounter: Payer: Self-pay | Admitting: Acute Care

## 2016-05-04 ENCOUNTER — Ambulatory Visit (INDEPENDENT_AMBULATORY_CARE_PROVIDER_SITE_OTHER): Payer: Medicare Other | Admitting: Acute Care

## 2016-05-04 ENCOUNTER — Ambulatory Visit (INDEPENDENT_AMBULATORY_CARE_PROVIDER_SITE_OTHER)
Admission: RE | Admit: 2016-05-04 | Discharge: 2016-05-04 | Disposition: A | Discharge status: Home / Self Care | Payer: Medicare Other | Source: Ambulatory Visit | Attending: Acute Care | Admitting: Acute Care

## 2016-05-04 DIAGNOSIS — Z87891 Personal history of nicotine dependence: Secondary | ICD-10-CM | POA: Diagnosis not present

## 2016-05-04 DIAGNOSIS — F17210 Nicotine dependence, cigarettes, uncomplicated: Secondary | ICD-10-CM

## 2016-05-04 NOTE — Progress Notes (Signed)
Shared Decision Making Visit Lung Cancer Screening Program [phone])   Eligibility:  Age 61 y.o.  Pack Years Smoking History Calculation 37 (# packs/per year x # years smoked)  Recent History of coughing up blood  no  Unexplained weight loss? no ( >Than 15 pounds within the last 6 months )  Prior History Lung / other cancer no (Diagnosis within the last 5 years already requiring surveillance chest CT Scans).  Smoking Status Current Smoker  Former Smokers: Years since quit:NA  Quit Date: NA  Visit Components:  Discussion included one or more decision making aids. yes  Discussion included risk/benefits of screening. yes  Discussion included potential follow up diagnostic testing for abnormal scans. yes  Discussion included meaning and risk of over diagnosis. yes  Discussion included meaning and risk of False Positives. yes  Discussion included meaning of total radiation exposure. yes  Counseling Included:  Importance of adherence to annual lung cancer LDCT screening. yes  Impact of comorbidities on ability to participate in the program. yes  Ability and willingness to under diagnostic treatment. yes  Smoking Cessation Counseling:  Current Smokers:   Discussed importance of smoking cessation. yes  Information about tobacco cessation classes and interventions provided to patient. yes  Patient provided with "ticket" for LDCT Scan. yes  Symptomatic Patient. no  Counseling  Diagnosis Code: Tobacco Use Z72.0  Asymptomatic Patient yes  Counseling (Intermediate counseling: > three minutes counseling) [id number]  Former Smokers:   Discussed the importance of maintaining cigarette abstinence. yes  Diagnosis Code: Personal History of Nicotine Dependence. [id number]  Information about tobacco cessation classes and interventions provided to patient. Yes  Patient provided with "ticket" for LDCT Scan. yes  Written Order for Lung Cancer Screening with LDCT placed in  Epic. Yes (CT Chest Lung Cancer Screening Low Dose W/O CM) [id number] Z12.2-Screening of respiratory organs Z87.891-Personal history of nicotine dependence  I have spent 20 minutes of face to face time with Ms. Yohman discussing the risks and benefits of lung cancer screening. We viewed a power point together that explained in detail the above noted topics. We paused at intervals to allow for questions to be asked and answered to ensure understanding.We discussed that the single most powerful action that she can take to decrease her risk of developing lung cancer is to quit smoking. We discussed whether or not she is ready to commit to setting a quit date.She is currently not ready to set a quit date. We discussed options for tools to aid in quitting smoking including nicotine replacement therapy, non-nicotine medications, support groups, Quit Smart classes, and behavior modification. We discussed that often times setting smaller, more achievable goals, such as eliminating 1 cigarette a day for a week and then 2 cigarettes a day for a week can be helpful in slowly decreasing the number of cigarettes smoked. This allows for a sense of accomplishment as well as providing a clinical benefit. I gave her the " Be Stronger Than Your Excuses" card with contact information for community resources, classes, free nicotine replacement therapy, and access to mobile apps, text messaging, and on-line smoking cessation help. I have also given her  my card and contact information in the event  needs to contact me. We discussed the time and location of the scan, and that either June Leap, CMA, or I will call with the results within 24-48 hours of receiving them. I have provided her with a copy of the power point we viewed  as a resource in the  event they need reinforcement of the concepts we discussed today in the office. The patient verbalized understanding of all of  the above and had no further questions upon leaving the  office. They have my contact information in the event they have any further questions.  We discussed the high incidence of CAD noted on these exams. I explained that as a non-gated exam the degree or severity could not be determined, just the presence. She is currently taking both Lipitor and Zetia for cholesterol management. She has her cholesterol evaluated annually by her PCP. She vebalized understanding of the above.   Magdalen Spatz, NP 05/04/2016

## 2016-05-11 ENCOUNTER — Other Ambulatory Visit: Payer: Self-pay | Admitting: Cardiovascular Disease

## 2016-05-12 ENCOUNTER — Telehealth: Payer: Self-pay | Admitting: Acute Care

## 2016-05-12 ENCOUNTER — Ambulatory Visit (INDEPENDENT_AMBULATORY_CARE_PROVIDER_SITE_OTHER): Payer: Medicare Other | Admitting: *Deleted

## 2016-05-12 DIAGNOSIS — R55 Syncope and collapse: Secondary | ICD-10-CM

## 2016-05-12 NOTE — Progress Notes (Signed)
Carelink Summary Report / Loop Recorder 

## 2016-05-12 NOTE — Telephone Encounter (Signed)
Pt. Called for CT results. Please call her with results. Thanks so much.

## 2016-05-12 NOTE — Telephone Encounter (Signed)
Pt is requesting lung cancer screening CT results. Message will be route to SG to address.

## 2016-05-13 ENCOUNTER — Other Ambulatory Visit: Payer: Self-pay | Admitting: Acute Care

## 2016-05-13 DIAGNOSIS — F17210 Nicotine dependence, cigarettes, uncomplicated: Secondary | ICD-10-CM

## 2016-05-13 NOTE — Telephone Encounter (Signed)
Patient called regarding CT results - she can be reached at [phone] -pr

## 2016-05-17 DIAGNOSIS — M79604 Pain in right leg: Secondary | ICD-10-CM | POA: Diagnosis not present

## 2016-05-17 DIAGNOSIS — M4800 Spinal stenosis, site unspecified: Secondary | ICD-10-CM | POA: Diagnosis not present

## 2016-05-17 DIAGNOSIS — M25551 Pain in right hip: Secondary | ICD-10-CM | POA: Diagnosis not present

## 2016-05-17 DIAGNOSIS — Z72820 Sleep deprivation: Secondary | ICD-10-CM | POA: Diagnosis not present

## 2016-05-23 ENCOUNTER — Ambulatory Visit (INDEPENDENT_AMBULATORY_CARE_PROVIDER_SITE_OTHER): Payer: Medicare Other | Admitting: Sports Medicine

## 2016-05-23 ENCOUNTER — Encounter: Payer: Self-pay | Admitting: Sports Medicine

## 2016-05-23 VITALS — BP 113/62 | HR 68 | Ht 63.0 in | Wt 185.0 lb

## 2016-05-23 DIAGNOSIS — M79604 Pain in right leg: Secondary | ICD-10-CM | POA: Diagnosis not present

## 2016-05-23 LAB — CUP PACEART REMOTE DEVICE CHECK
Date Time Interrogation Session: 20180102093600
Implantable Pulse Generator Implant Date: 20160112

## 2016-05-23 NOTE — Progress Notes (Signed)
   Subjective:    Patient ID: [name], female    DOB: 05/29/1955, 61 y.o.   MRN: [id number]  HPI chief complaint: Right hip and leg pain  Very pleasant 61 year old female comes in today at the request of Dr.Pharr for evaluation. In October, she was picking up a 200 pound object when she felt an acute pain and catch in her right hip. Her pain was initially diffuse including in the groin but since then her pain has started to localize itself more in her thigh. In addition, she has noticed posterior lateral hip pain especially with pressure in this area. She has pain both with sitting and walking but it is much worse with prolonged sitting. She denies radiating pain past the knee. She does have a remote history of hip problems when she was younger but she is unsure of a specific diagnosis. She has not had any hip surgeries. She does have low back pain but it is chronic. She has had x-rays done through her primary care physician's office and those x-rays are available for review.  Past medical history reviewed Medications reviewed Allergies reviewed    Review of Systems    as above Objective:   Physical Exam  Well-developed, well-nourished. No acute distress. Patient is more comfortable standing than sitting.  Examination of the right hip shows full active and passive range of motion. She does have reproducible pain with the extreme of internal rotation. Reproducible pain with resisted hip flexion as well. She is tender to palpation just posterior to the greater trochanter. She has weakness in all planes secondary to pain. No tenderness to palpation directly over the greater trochanter. No tenderness to palpation along her femur. Neurovascularly intact distally. Walking with a limp.  X-rays of her right hip including an AP pelvis and frog-leg view show no significant degenerative changes and nothing acute.      Assessment & Plan:   Right hip pain worrisome for occult femoral neck  fracture versus labral tear versus tendon injury  Patient's pain is quite severe. Her x-rays are unremarkable. This all started with an injury to her hip in October. We need more information so I'll order an MRI of her right hip. She has a history of spinal stenosis but her presentation today is not typical for what we see with that diagnosis. I will follow-up with her via telephone with her MRI results once available. We will delineate a more definitive treatment plan based on those results.

## 2016-05-25 NOTE — Telephone Encounter (Signed)
Per CT imaging report: Notes Recorded by Magdalen Spatz, NP on 05/13/2016 at 2:53 PM EST These results have been called to the patient.She verbalized understanding, and understands that we will order and schedule her annual scan in 04/2017  Will close message.  Nothing further needed

## 2016-05-25 NOTE — Telephone Encounter (Signed)
Forwarding to [place] to reach out to Pt.

## 2016-05-27 ENCOUNTER — Ambulatory Visit
Admission: RE | Admit: 2016-05-27 | Discharge: 2016-05-27 | Disposition: A | Discharge status: Home / Self Care | Payer: Medicare Other | Source: Ambulatory Visit | Attending: Sports Medicine | Admitting: Sports Medicine

## 2016-05-27 DIAGNOSIS — M25551 Pain in right hip: Secondary | ICD-10-CM | POA: Diagnosis not present

## 2016-05-27 DIAGNOSIS — M79604 Pain in right leg: Secondary | ICD-10-CM

## 2016-05-31 ENCOUNTER — Telehealth: Payer: Self-pay | Admitting: Sports Medicine

## 2016-05-31 NOTE — Telephone Encounter (Signed)
  I spoke with the patient on the phone today after reviewing the MRI of her right hip. She has greater trochanteric bursitis. Also some associated tendinosis of the right gluteus minimus. Based on these findings I've recommended a trial of physical therapy. She lives in [place] so we will try to arrange for physical therapy in [place]. I also recommended a round of prednisone or a steroid injection. Patient tells me that she has prednisone at home and that it does help. If she continues to have pain despite physical therapy then we could proceed with a cortisone injection. She will follow-up as needed.

## 2016-06-07 DIAGNOSIS — M7061 Trochanteric bursitis, right hip: Secondary | ICD-10-CM | POA: Diagnosis not present

## 2016-06-07 DIAGNOSIS — M7631 Iliotibial band syndrome, right leg: Secondary | ICD-10-CM | POA: Diagnosis not present

## 2016-06-07 DIAGNOSIS — M25551 Pain in right hip: Secondary | ICD-10-CM | POA: Diagnosis not present

## 2016-06-07 DIAGNOSIS — M62551 Muscle wasting and atrophy, not elsewhere classified, right thigh: Secondary | ICD-10-CM | POA: Diagnosis not present

## 2016-06-09 DIAGNOSIS — M7631 Iliotibial band syndrome, right leg: Secondary | ICD-10-CM | POA: Diagnosis not present

## 2016-06-09 DIAGNOSIS — M62551 Muscle wasting and atrophy, not elsewhere classified, right thigh: Secondary | ICD-10-CM | POA: Diagnosis not present

## 2016-06-09 DIAGNOSIS — M7061 Trochanteric bursitis, right hip: Secondary | ICD-10-CM | POA: Diagnosis not present

## 2016-06-09 DIAGNOSIS — M25551 Pain in right hip: Secondary | ICD-10-CM | POA: Diagnosis not present

## 2016-06-10 LAB — CUP PACEART REMOTE DEVICE CHECK
MDC IDC PG IMPLANT DT: 20160112
MDC IDC SESS DTM: 20180201093953

## 2016-06-13 ENCOUNTER — Ambulatory Visit (INDEPENDENT_AMBULATORY_CARE_PROVIDER_SITE_OTHER): Payer: Medicare Other | Admitting: *Deleted

## 2016-06-13 DIAGNOSIS — R55 Syncope and collapse: Secondary | ICD-10-CM

## 2016-06-13 DIAGNOSIS — M7631 Iliotibial band syndrome, right leg: Secondary | ICD-10-CM | POA: Diagnosis not present

## 2016-06-13 DIAGNOSIS — M25551 Pain in right hip: Secondary | ICD-10-CM | POA: Diagnosis not present

## 2016-06-13 DIAGNOSIS — M62551 Muscle wasting and atrophy, not elsewhere classified, right thigh: Secondary | ICD-10-CM | POA: Diagnosis not present

## 2016-06-13 DIAGNOSIS — M7061 Trochanteric bursitis, right hip: Secondary | ICD-10-CM | POA: Diagnosis not present

## 2016-06-14 NOTE — Progress Notes (Signed)
Carelink Summary Report / Loop Recorder 

## 2016-06-15 DIAGNOSIS — M7631 Iliotibial band syndrome, right leg: Secondary | ICD-10-CM | POA: Diagnosis not present

## 2016-06-15 DIAGNOSIS — M7061 Trochanteric bursitis, right hip: Secondary | ICD-10-CM | POA: Diagnosis not present

## 2016-06-15 DIAGNOSIS — M62551 Muscle wasting and atrophy, not elsewhere classified, right thigh: Secondary | ICD-10-CM | POA: Diagnosis not present

## 2016-06-15 DIAGNOSIS — M25551 Pain in right hip: Secondary | ICD-10-CM | POA: Diagnosis not present

## 2016-06-17 DIAGNOSIS — L4050 Arthropathic psoriasis, unspecified: Secondary | ICD-10-CM | POA: Diagnosis not present

## 2016-06-17 DIAGNOSIS — M4802 Spinal stenosis, cervical region: Secondary | ICD-10-CM | POA: Diagnosis not present

## 2016-06-17 DIAGNOSIS — G894 Chronic pain syndrome: Secondary | ICD-10-CM | POA: Diagnosis not present

## 2016-06-17 DIAGNOSIS — M25551 Pain in right hip: Secondary | ICD-10-CM | POA: Diagnosis not present

## 2016-06-17 DIAGNOSIS — Z5181 Encounter for therapeutic drug level monitoring: Secondary | ICD-10-CM | POA: Diagnosis not present

## 2016-06-17 DIAGNOSIS — M7061 Trochanteric bursitis, right hip: Secondary | ICD-10-CM | POA: Diagnosis not present

## 2016-06-17 DIAGNOSIS — M1288 Other specific arthropathies, not elsewhere classified, other specified site: Secondary | ICD-10-CM | POA: Diagnosis not present

## 2016-06-17 DIAGNOSIS — M533 Sacrococcygeal disorders, not elsewhere classified: Secondary | ICD-10-CM | POA: Diagnosis not present

## 2016-06-17 DIAGNOSIS — Z79891 Long term (current) use of opiate analgesic: Secondary | ICD-10-CM | POA: Diagnosis not present

## 2016-06-17 DIAGNOSIS — M62551 Muscle wasting and atrophy, not elsewhere classified, right thigh: Secondary | ICD-10-CM | POA: Diagnosis not present

## 2016-06-17 DIAGNOSIS — M7631 Iliotibial band syndrome, right leg: Secondary | ICD-10-CM | POA: Diagnosis not present

## 2016-06-20 DIAGNOSIS — M533 Sacrococcygeal disorders, not elsewhere classified: Secondary | ICD-10-CM | POA: Diagnosis not present

## 2016-06-29 LAB — CUP PACEART REMOTE DEVICE CHECK
Implantable Pulse Generator Implant Date: 20160112
MDC IDC SESS DTM: 20180303093729

## 2016-06-30 DIAGNOSIS — Z72820 Sleep deprivation: Secondary | ICD-10-CM | POA: Diagnosis not present

## 2016-06-30 DIAGNOSIS — M4800 Spinal stenosis, site unspecified: Secondary | ICD-10-CM | POA: Diagnosis not present

## 2016-07-11 ENCOUNTER — Ambulatory Visit (INDEPENDENT_AMBULATORY_CARE_PROVIDER_SITE_OTHER): Payer: Medicare Other | Admitting: *Deleted

## 2016-07-11 DIAGNOSIS — R55 Syncope and collapse: Secondary | ICD-10-CM

## 2016-07-11 NOTE — Progress Notes (Signed)
Carelink Summary Report / Loop Recorder 

## 2016-07-14 DIAGNOSIS — R768 Other specified abnormal immunological findings in serum: Secondary | ICD-10-CM | POA: Diagnosis not present

## 2016-07-14 DIAGNOSIS — Z79899 Other long term (current) drug therapy: Secondary | ICD-10-CM | POA: Diagnosis not present

## 2016-07-14 DIAGNOSIS — L4050 Arthropathic psoriasis, unspecified: Secondary | ICD-10-CM | POA: Diagnosis not present

## 2016-07-14 DIAGNOSIS — M150 Primary generalized (osteo)arthritis: Secondary | ICD-10-CM | POA: Diagnosis not present

## 2016-07-14 LAB — CUP PACEART REMOTE DEVICE CHECK
MDC IDC PG IMPLANT DT: 20160112
MDC IDC SESS DTM: 20180402100847

## 2016-07-26 DIAGNOSIS — J029 Acute pharyngitis, unspecified: Secondary | ICD-10-CM | POA: Diagnosis not present

## 2016-07-26 DIAGNOSIS — J0100 Acute maxillary sinusitis, unspecified: Secondary | ICD-10-CM | POA: Diagnosis not present

## 2016-07-26 DIAGNOSIS — R1084 Generalized abdominal pain: Secondary | ICD-10-CM | POA: Diagnosis not present

## 2016-07-27 ENCOUNTER — Ambulatory Visit (HOSPITAL_COMMUNITY): Payer: Medicare Other

## 2016-08-01 ENCOUNTER — Other Ambulatory Visit (HOSPITAL_COMMUNITY): Payer: Self-pay | Admitting: *Deleted

## 2016-08-02 ENCOUNTER — Ambulatory Visit (HOSPITAL_COMMUNITY): Admission: RE | Admit: 2016-08-02 | Payer: Medicare Other | Source: Ambulatory Visit

## 2016-08-08 DIAGNOSIS — N281 Cyst of kidney, acquired: Secondary | ICD-10-CM | POA: Diagnosis not present

## 2016-08-10 ENCOUNTER — Ambulatory Visit (INDEPENDENT_AMBULATORY_CARE_PROVIDER_SITE_OTHER): Payer: Medicare Other | Admitting: *Deleted

## 2016-08-10 DIAGNOSIS — R55 Syncope and collapse: Secondary | ICD-10-CM

## 2016-08-10 NOTE — Progress Notes (Signed)
Carelink Summary Report / Loop Recorder 

## 2016-08-16 DIAGNOSIS — L4050 Arthropathic psoriasis, unspecified: Secondary | ICD-10-CM | POA: Diagnosis not present

## 2016-08-16 DIAGNOSIS — M4690 Unspecified inflammatory spondylopathy, site unspecified: Secondary | ICD-10-CM | POA: Diagnosis not present

## 2016-08-16 DIAGNOSIS — M533 Sacrococcygeal disorders, not elsewhere classified: Secondary | ICD-10-CM | POA: Diagnosis not present

## 2016-08-22 LAB — CUP PACEART REMOTE DEVICE CHECK
Date Time Interrogation Session: 20180502103621
Implantable Pulse Generator Implant Date: 20160112

## 2016-09-08 DIAGNOSIS — N281 Cyst of kidney, acquired: Secondary | ICD-10-CM | POA: Diagnosis not present

## 2016-09-08 DIAGNOSIS — C642 Malignant neoplasm of left kidney, except renal pelvis: Secondary | ICD-10-CM | POA: Diagnosis not present

## 2016-09-09 ENCOUNTER — Ambulatory Visit (INDEPENDENT_AMBULATORY_CARE_PROVIDER_SITE_OTHER): Payer: Medicare Other | Admitting: *Deleted

## 2016-09-09 DIAGNOSIS — R55 Syncope and collapse: Secondary | ICD-10-CM | POA: Diagnosis not present

## 2016-09-09 NOTE — Progress Notes (Signed)
Carelink Summary Report / Loop Recorder 

## 2016-09-12 DIAGNOSIS — H2513 Age-related nuclear cataract, bilateral: Secondary | ICD-10-CM | POA: Diagnosis not present

## 2016-09-12 LAB — CUP PACEART REMOTE DEVICE CHECK
Date Time Interrogation Session: 20180601103707
MDC IDC PG IMPLANT DT: 20160112

## 2016-10-10 ENCOUNTER — Ambulatory Visit (INDEPENDENT_AMBULATORY_CARE_PROVIDER_SITE_OTHER): Payer: Medicare Other | Admitting: *Deleted

## 2016-10-10 DIAGNOSIS — R55 Syncope and collapse: Secondary | ICD-10-CM | POA: Diagnosis not present

## 2016-10-10 NOTE — Progress Notes (Signed)
Carelink Summary Report / Loop Recorder 

## 2016-10-14 DIAGNOSIS — M4690 Unspecified inflammatory spondylopathy, site unspecified: Secondary | ICD-10-CM | POA: Diagnosis not present

## 2016-10-14 DIAGNOSIS — L4050 Arthropathic psoriasis, unspecified: Secondary | ICD-10-CM | POA: Diagnosis not present

## 2016-10-14 DIAGNOSIS — M533 Sacrococcygeal disorders, not elsewhere classified: Secondary | ICD-10-CM | POA: Diagnosis not present

## 2016-10-14 DIAGNOSIS — M4802 Spinal stenosis, cervical region: Secondary | ICD-10-CM | POA: Diagnosis not present

## 2016-10-25 ENCOUNTER — Encounter (HOSPITAL_COMMUNITY): Payer: Self-pay

## 2016-10-25 ENCOUNTER — Ambulatory Visit (HOSPITAL_COMMUNITY)
Admission: RE | Admit: 2016-10-25 | Discharge: 2016-10-25 | Disposition: A | Discharge status: Home / Self Care | Payer: Medicare Other | Source: Ambulatory Visit | Attending: Rheumatology | Admitting: Rheumatology

## 2016-10-25 DIAGNOSIS — L4050 Arthropathic psoriasis, unspecified: Secondary | ICD-10-CM | POA: Insufficient documentation

## 2016-10-25 MED ORDER — ABATACEPT 250 MG IV SOLR
750.0000 mg | Freq: Once | INTRAVENOUS | Status: AC
  Administered 2016-10-25: 750 mg via INTRAVENOUS
  Filled 2016-10-25: qty 30

## 2016-10-25 MED ORDER — SODIUM CHLORIDE 0.9 % IV SOLN
INTRAVENOUS | Status: DC
  Administered 2016-10-25: 10:00:00 via INTRAVENOUS

## 2016-10-25 NOTE — Discharge Instructions (Signed)
Abatacept solution for injection (subcutaneous or intravenous use)  What is this medicine?  ABATACEPT (a ba TA sept) is used to treat moderate to severe active rheumatoid arthritis or psoriatic arthritis in adults. This medicine is also used to treat juvenile idiopathic arthritis.  This medicine may be used for other purposes; ask your health care provider or pharmacist if you have questions.  COMMON BRAND NAME(S): Orencia  What should I tell my health care provider before I take this medicine?  They need to know if you have any of these conditions:  -are taking other medicines to treat rheumatoid arthritis  -COPD  -diabetes  -infection or history of infections  -recently received or scheduled to receive a vaccine  -scheduled to have surgery  -tuberculosis, a positive skin test for tuberculosis or have recently been in close contact with someone who has tuberculosis  -viral hepatitis  -an unusual or allergic reaction to abatacept, other medicines, foods, dyes, or preservatives  -pregnant or trying to get pregnant  -breast-feeding  How should I use this medicine?  This medicine is for infusion into a vein or for injection under the skin. Infusions are given by a health care professional in a hospital or clinic setting. If you are to give your own medicine at home, you will be taught how to prepare and give this medicine under the skin. Use exactly as directed. Take your medicine at regular intervals. Do not take your medicine more often than directed.  It is important that you put your used needles and syringes in a special sharps container. Do not put them in a trash can. If you do not have a sharps container, call your pharmacist or healthcare provider to get one.  Talk to your pediatrician regarding the use of this medicine in children. While infusions in a clinic may be prescribed for children as young as 2 years for selected conditions, precautions do apply.  Overdosage: If you think you have taken too much of  this medicine contact a poison control center or emergency room at once.  NOTE: This medicine is only for you. Do not share this medicine with others.  What if I miss a dose?  This medicine is used once a week if given by injection under the skin. If you miss a dose, take it as soon as you can. If it is almost time for your next dose, take only that dose. Do not take double or extra doses.  If you are to be given an infusion, it is important not to miss your dose. Doses are usually every 4 weeks. Call your doctor or health care professional if you are unable to keep an appointment.  What may interact with this medicine?  Do not take this medicine with any of the following medications:  -adalimumab  -anakinra  -certolizumab  -etanercept  -golimumab  -infliximab  -live virus vaccines  -rituximab  -tocilizumab  This medicine may also interact with the following medications:  -vaccines  This list may not describe all possible interactions. Give your health care provider a list of all the medicines, herbs, non-prescription drugs, or dietary supplements you use. Also tell them if you smoke, drink alcohol, or use illegal drugs. Some items may interact with your medicine.  What should I watch for while using this medicine?  Visit your doctor for regular check ups while you are taking this medicine. Tell your doctor or healthcare professional if your symptoms do not start to get better or if they   get worse.  Call your doctor or health care professional if you get a cold or other infection while receiving this medicine. Do not treat yourself. This medicine may decrease your body's ability to fight infection. Try to avoid being around people who are sick.  What side effects may I notice from receiving this medicine?  Side effects that you should report to your doctor or health care professional as soon as possible:  -allergic reactions like skin rash, itching or hives, swelling of the face, lips, or tongue  -breathing  problems  -chest pain  -signs of infection - fever or chills, cough, unusual tiredness, pain or trouble passing urine, or warm, red or painful skin  Side effects that usually do not require medical attention (report to your doctor or health care professional if they continue or are bothersome):  -dizziness  -headache  -nausea, vomiting  -sore throat  -stomach upset  This list may not describe all possible side effects. Call your doctor for medical advice about side effects. You may report side effects to FDA at 1-800-FDA-1088.  Where should I keep my medicine?  Infusions will be given in a hospital or clinic and will not be stored at home.  Storage for syringes given under the skin and stored at home:  Keep out of the reach of children. Store in a refrigerator between 2 and 8 degrees C (36 and 46 degrees F). Keep this medicine in the original container. Protect from light. Do not freeze. Throw away any unused medicine after the expiration date.  NOTE: This sheet is a summary. It may not cover all possible information. If you have questions about this medicine, talk to your doctor, pharmacist, or health care provider.   2018 Elsevier/Gold Standard (2015-10-15 10:07:35)

## 2016-10-25 NOTE — Progress Notes (Signed)
Called and spoke with Joy, Dr. Audelia Hives nurse. Aware that pt had Enbrel injection on 10/18/16, okay to proceed with Orencia today with order received to draw Quantiferon Gold for TB.

## 2016-10-26 LAB — CUP PACEART REMOTE DEVICE CHECK
MDC IDC PG IMPLANT DT: 20160112
MDC IDC SESS DTM: 20180701113950

## 2016-10-28 LAB — QUANTIFERON IN TUBE
QUANTIFERON MITOGEN VALUE: 7.07 IU/mL
QUANTIFERON TB AG VALUE: 0.03 [IU]/mL
QUANTIFERON TB GOLD: NEGATIVE
Quantiferon Nil Value: 0.04 IU/mL

## 2016-10-28 LAB — QUANTIFERON TB GOLD ASSAY (BLOOD)

## 2016-10-31 DIAGNOSIS — M4690 Unspecified inflammatory spondylopathy, site unspecified: Secondary | ICD-10-CM | POA: Diagnosis not present

## 2016-10-31 DIAGNOSIS — M533 Sacrococcygeal disorders, not elsewhere classified: Secondary | ICD-10-CM | POA: Diagnosis not present

## 2016-10-31 DIAGNOSIS — L4050 Arthropathic psoriasis, unspecified: Secondary | ICD-10-CM | POA: Diagnosis not present

## 2016-11-07 ENCOUNTER — Other Ambulatory Visit (HOSPITAL_COMMUNITY): Payer: Self-pay | Admitting: *Deleted

## 2016-11-08 ENCOUNTER — Ambulatory Visit (INDEPENDENT_AMBULATORY_CARE_PROVIDER_SITE_OTHER): Payer: Medicare Other | Admitting: *Deleted

## 2016-11-08 ENCOUNTER — Ambulatory Visit (HOSPITAL_COMMUNITY)
Admission: RE | Admit: 2016-11-08 | Discharge: 2016-11-08 | Disposition: A | Discharge status: Home / Self Care | Payer: Medicare Other | Source: Ambulatory Visit | Attending: Rheumatology | Admitting: Rheumatology

## 2016-11-08 DIAGNOSIS — R55 Syncope and collapse: Secondary | ICD-10-CM | POA: Diagnosis not present

## 2016-11-08 DIAGNOSIS — L4050 Arthropathic psoriasis, unspecified: Secondary | ICD-10-CM | POA: Diagnosis not present

## 2016-11-08 MED ORDER — ABATACEPT 250 MG IV SOLR
750.0000 mg | INTRAVENOUS | Status: DC
  Administered 2016-11-08: 750 mg via INTRAVENOUS
  Filled 2016-11-08: qty 30

## 2016-11-08 MED ORDER — SODIUM CHLORIDE 0.9 % IV SOLN
INTRAVENOUS | Status: DC
  Administered 2016-11-08: 08:00:00 via INTRAVENOUS

## 2016-11-08 NOTE — Progress Notes (Signed)
Carelink Summary Report / Loop Recorder 

## 2016-11-14 DIAGNOSIS — M533 Sacrococcygeal disorders, not elsewhere classified: Secondary | ICD-10-CM | POA: Diagnosis not present

## 2016-11-14 DIAGNOSIS — L4050 Arthropathic psoriasis, unspecified: Secondary | ICD-10-CM | POA: Diagnosis not present

## 2016-11-14 DIAGNOSIS — M4690 Unspecified inflammatory spondylopathy, site unspecified: Secondary | ICD-10-CM | POA: Diagnosis not present

## 2016-11-17 DIAGNOSIS — G8929 Other chronic pain: Secondary | ICD-10-CM | POA: Diagnosis not present

## 2016-11-21 LAB — CUP PACEART REMOTE DEVICE CHECK
Date Time Interrogation Session: 20180731120953
Implantable Pulse Generator Implant Date: 20160112

## 2016-11-22 ENCOUNTER — Ambulatory Visit (HOSPITAL_COMMUNITY)
Admission: RE | Admit: 2016-11-22 | Discharge: 2016-11-22 | Disposition: A | Discharge status: Home / Self Care | Payer: Medicare Other | Source: Ambulatory Visit | Attending: Rheumatology | Admitting: Rheumatology

## 2016-11-22 DIAGNOSIS — L4050 Arthropathic psoriasis, unspecified: Secondary | ICD-10-CM | POA: Diagnosis not present

## 2016-11-22 MED ORDER — SODIUM CHLORIDE 0.9 % IV SOLN
INTRAVENOUS | Status: DC
  Administered 2016-11-22: 09:00:00 via INTRAVENOUS

## 2016-11-22 MED ORDER — SODIUM CHLORIDE 0.9 % IV SOLN
750.0000 mg | INTRAVENOUS | Status: DC
  Administered 2016-11-22: 750 mg via INTRAVENOUS
  Filled 2016-11-22: qty 30

## 2016-11-24 DIAGNOSIS — M150 Primary generalized (osteo)arthritis: Secondary | ICD-10-CM | POA: Diagnosis not present

## 2016-11-24 DIAGNOSIS — R768 Other specified abnormal immunological findings in serum: Secondary | ICD-10-CM | POA: Diagnosis not present

## 2016-11-24 DIAGNOSIS — L4050 Arthropathic psoriasis, unspecified: Secondary | ICD-10-CM | POA: Diagnosis not present

## 2016-11-24 DIAGNOSIS — Z79899 Other long term (current) drug therapy: Secondary | ICD-10-CM | POA: Diagnosis not present

## 2016-11-28 DIAGNOSIS — G8929 Other chronic pain: Secondary | ICD-10-CM | POA: Diagnosis not present

## 2016-11-28 DIAGNOSIS — I10 Essential (primary) hypertension: Secondary | ICD-10-CM | POA: Diagnosis not present

## 2016-11-28 DIAGNOSIS — L4050 Arthropathic psoriasis, unspecified: Secondary | ICD-10-CM | POA: Diagnosis not present

## 2016-11-28 DIAGNOSIS — M545 Low back pain: Secondary | ICD-10-CM | POA: Diagnosis not present

## 2016-12-07 DIAGNOSIS — Z5181 Encounter for therapeutic drug level monitoring: Secondary | ICD-10-CM | POA: Diagnosis not present

## 2016-12-07 DIAGNOSIS — M5416 Radiculopathy, lumbar region: Secondary | ICD-10-CM | POA: Diagnosis not present

## 2016-12-07 DIAGNOSIS — G894 Chronic pain syndrome: Secondary | ICD-10-CM | POA: Diagnosis not present

## 2016-12-07 DIAGNOSIS — M533 Sacrococcygeal disorders, not elsewhere classified: Secondary | ICD-10-CM | POA: Diagnosis not present

## 2016-12-07 DIAGNOSIS — L4050 Arthropathic psoriasis, unspecified: Secondary | ICD-10-CM | POA: Diagnosis not present

## 2016-12-07 DIAGNOSIS — M4802 Spinal stenosis, cervical region: Secondary | ICD-10-CM | POA: Diagnosis not present

## 2016-12-07 DIAGNOSIS — M4690 Unspecified inflammatory spondylopathy, site unspecified: Secondary | ICD-10-CM | POA: Diagnosis not present

## 2016-12-08 ENCOUNTER — Ambulatory Visit (INDEPENDENT_AMBULATORY_CARE_PROVIDER_SITE_OTHER): Payer: Medicare Other | Admitting: *Deleted

## 2016-12-08 DIAGNOSIS — R55 Syncope and collapse: Secondary | ICD-10-CM | POA: Diagnosis not present

## 2016-12-08 NOTE — Progress Notes (Signed)
Carelink Summary Report / Loop Recorder 

## 2016-12-09 LAB — CUP PACEART REMOTE DEVICE CHECK
Date Time Interrogation Session: 20180830125131
MDC IDC PG IMPLANT DT: 20160112

## 2016-12-09 NOTE — Progress Notes (Signed)
Carelink summary report received. Battery status OK. Normal device function. No new symptom episodes, tachy episodes, brady, or pause episodes. No new AF episodes. Monthly summary reports and ROV/PRN 

## 2016-12-20 ENCOUNTER — Encounter (HOSPITAL_COMMUNITY): Payer: Medicare Other

## 2016-12-28 DIAGNOSIS — L4050 Arthropathic psoriasis, unspecified: Secondary | ICD-10-CM | POA: Diagnosis not present

## 2016-12-28 DIAGNOSIS — I10 Essential (primary) hypertension: Secondary | ICD-10-CM | POA: Diagnosis not present

## 2016-12-28 DIAGNOSIS — M545 Low back pain: Secondary | ICD-10-CM | POA: Diagnosis not present

## 2016-12-28 DIAGNOSIS — Z79899 Other long term (current) drug therapy: Secondary | ICD-10-CM | POA: Diagnosis not present

## 2017-01-09 ENCOUNTER — Ambulatory Visit (INDEPENDENT_AMBULATORY_CARE_PROVIDER_SITE_OTHER): Payer: Medicare Other | Admitting: *Deleted

## 2017-01-09 DIAGNOSIS — R55 Syncope and collapse: Secondary | ICD-10-CM

## 2017-01-10 NOTE — Progress Notes (Signed)
Carelink Summary Report / Loop Recorder 

## 2017-01-12 LAB — CUP PACEART REMOTE DEVICE CHECK
Implantable Pulse Generator Implant Date: 20160112
MDC IDC SESS DTM: 20180929124231

## 2017-01-27 DIAGNOSIS — L4050 Arthropathic psoriasis, unspecified: Secondary | ICD-10-CM | POA: Diagnosis not present

## 2017-01-27 DIAGNOSIS — Z79899 Other long term (current) drug therapy: Secondary | ICD-10-CM | POA: Diagnosis not present

## 2017-01-27 DIAGNOSIS — M545 Low back pain: Secondary | ICD-10-CM | POA: Diagnosis not present

## 2017-02-01 DIAGNOSIS — E119 Type 2 diabetes mellitus without complications: Secondary | ICD-10-CM | POA: Diagnosis not present

## 2017-02-01 DIAGNOSIS — Z23 Encounter for immunization: Secondary | ICD-10-CM | POA: Diagnosis not present

## 2017-02-01 DIAGNOSIS — E7800 Pure hypercholesterolemia, unspecified: Secondary | ICD-10-CM | POA: Diagnosis not present

## 2017-02-01 DIAGNOSIS — R5383 Other fatigue: Secondary | ICD-10-CM | POA: Diagnosis not present

## 2017-02-01 DIAGNOSIS — Z0000 Encounter for general adult medical examination without abnormal findings: Secondary | ICD-10-CM | POA: Diagnosis not present

## 2017-02-01 DIAGNOSIS — M8580 Other specified disorders of bone density and structure, unspecified site: Secondary | ICD-10-CM | POA: Diagnosis not present

## 2017-02-01 DIAGNOSIS — I10 Essential (primary) hypertension: Secondary | ICD-10-CM | POA: Diagnosis not present

## 2017-02-06 ENCOUNTER — Ambulatory Visit (INDEPENDENT_AMBULATORY_CARE_PROVIDER_SITE_OTHER): Payer: Medicare Other | Admitting: *Deleted

## 2017-02-06 ENCOUNTER — Other Ambulatory Visit: Payer: Self-pay | Admitting: Internal Medicine

## 2017-02-06 DIAGNOSIS — Z6832 Body mass index (BMI) 32.0-32.9, adult: Secondary | ICD-10-CM | POA: Diagnosis not present

## 2017-02-06 DIAGNOSIS — N951 Menopausal and female climacteric states: Secondary | ICD-10-CM | POA: Diagnosis not present

## 2017-02-06 DIAGNOSIS — E7800 Pure hypercholesterolemia, unspecified: Secondary | ICD-10-CM | POA: Diagnosis not present

## 2017-02-06 DIAGNOSIS — I447 Left bundle-branch block, unspecified: Secondary | ICD-10-CM | POA: Diagnosis not present

## 2017-02-06 DIAGNOSIS — R55 Syncope and collapse: Secondary | ICD-10-CM | POA: Diagnosis not present

## 2017-02-06 DIAGNOSIS — M3500 Sicca syndrome, unspecified: Secondary | ICD-10-CM | POA: Diagnosis not present

## 2017-02-06 DIAGNOSIS — Z79899 Other long term (current) drug therapy: Secondary | ICD-10-CM | POA: Diagnosis not present

## 2017-02-06 DIAGNOSIS — I700 Atherosclerosis of aorta: Secondary | ICD-10-CM | POA: Diagnosis not present

## 2017-02-06 DIAGNOSIS — J439 Emphysema, unspecified: Secondary | ICD-10-CM | POA: Diagnosis not present

## 2017-02-06 DIAGNOSIS — K219 Gastro-esophageal reflux disease without esophagitis: Secondary | ICD-10-CM | POA: Diagnosis not present

## 2017-02-06 DIAGNOSIS — R1310 Dysphagia, unspecified: Secondary | ICD-10-CM | POA: Diagnosis not present

## 2017-02-06 DIAGNOSIS — E039 Hypothyroidism, unspecified: Secondary | ICD-10-CM | POA: Diagnosis not present

## 2017-02-06 DIAGNOSIS — I10 Essential (primary) hypertension: Secondary | ICD-10-CM | POA: Diagnosis not present

## 2017-02-06 DIAGNOSIS — E119 Type 2 diabetes mellitus without complications: Secondary | ICD-10-CM | POA: Diagnosis not present

## 2017-02-07 NOTE — Progress Notes (Signed)
Carelink Summary Report / Loop Recorder 

## 2017-02-08 DIAGNOSIS — E119 Type 2 diabetes mellitus without complications: Secondary | ICD-10-CM | POA: Diagnosis not present

## 2017-02-08 DIAGNOSIS — E041 Nontoxic single thyroid nodule: Secondary | ICD-10-CM | POA: Diagnosis not present

## 2017-02-08 DIAGNOSIS — E039 Hypothyroidism, unspecified: Secondary | ICD-10-CM | POA: Diagnosis not present

## 2017-02-08 DIAGNOSIS — I10 Essential (primary) hypertension: Secondary | ICD-10-CM | POA: Diagnosis not present

## 2017-02-09 ENCOUNTER — Other Ambulatory Visit: Payer: Self-pay | Admitting: Endocrinology

## 2017-02-09 DIAGNOSIS — E041 Nontoxic single thyroid nodule: Secondary | ICD-10-CM

## 2017-02-09 LAB — CUP PACEART REMOTE DEVICE CHECK
Date Time Interrogation Session: 20181029164021
Implantable Pulse Generator Implant Date: 20160112

## 2017-02-10 ENCOUNTER — Other Ambulatory Visit: Payer: Medicare Other

## 2017-02-13 DIAGNOSIS — M25572 Pain in left ankle and joints of left foot: Secondary | ICD-10-CM | POA: Diagnosis not present

## 2017-02-13 DIAGNOSIS — Z79899 Other long term (current) drug therapy: Secondary | ICD-10-CM | POA: Diagnosis not present

## 2017-02-13 DIAGNOSIS — L4050 Arthropathic psoriasis, unspecified: Secondary | ICD-10-CM | POA: Diagnosis not present

## 2017-02-13 DIAGNOSIS — M150 Primary generalized (osteo)arthritis: Secondary | ICD-10-CM | POA: Diagnosis not present

## 2017-02-13 DIAGNOSIS — R768 Other specified abnormal immunological findings in serum: Secondary | ICD-10-CM | POA: Diagnosis not present

## 2017-02-14 ENCOUNTER — Ambulatory Visit
Admission: RE | Admit: 2017-02-14 | Discharge: 2017-02-14 | Disposition: A | Discharge status: Home / Self Care | Payer: Medicare Other | Source: Ambulatory Visit | Attending: Internal Medicine | Admitting: Internal Medicine

## 2017-02-14 ENCOUNTER — Ambulatory Visit
Admission: RE | Admit: 2017-02-14 | Discharge: 2017-02-14 | Disposition: A | Discharge status: Home / Self Care | Payer: Medicare Other | Source: Ambulatory Visit | Attending: Endocrinology | Admitting: Endocrinology

## 2017-02-14 DIAGNOSIS — E049 Nontoxic goiter, unspecified: Secondary | ICD-10-CM | POA: Diagnosis not present

## 2017-02-14 DIAGNOSIS — R1310 Dysphagia, unspecified: Secondary | ICD-10-CM

## 2017-02-14 DIAGNOSIS — E041 Nontoxic single thyroid nodule: Secondary | ICD-10-CM

## 2017-02-16 DIAGNOSIS — Z1231 Encounter for screening mammogram for malignant neoplasm of breast: Secondary | ICD-10-CM | POA: Diagnosis not present

## 2017-02-22 DIAGNOSIS — K449 Diaphragmatic hernia without obstruction or gangrene: Secondary | ICD-10-CM | POA: Diagnosis not present

## 2017-02-22 DIAGNOSIS — K219 Gastro-esophageal reflux disease without esophagitis: Secondary | ICD-10-CM | POA: Diagnosis not present

## 2017-02-22 DIAGNOSIS — R1310 Dysphagia, unspecified: Secondary | ICD-10-CM | POA: Diagnosis not present

## 2017-02-27 DIAGNOSIS — M79605 Pain in left leg: Secondary | ICD-10-CM | POA: Diagnosis not present

## 2017-02-27 DIAGNOSIS — Z79899 Other long term (current) drug therapy: Secondary | ICD-10-CM | POA: Diagnosis not present

## 2017-02-27 DIAGNOSIS — M542 Cervicalgia: Secondary | ICD-10-CM | POA: Diagnosis not present

## 2017-02-27 DIAGNOSIS — M79604 Pain in right leg: Secondary | ICD-10-CM | POA: Diagnosis not present

## 2017-02-27 DIAGNOSIS — M545 Low back pain: Secondary | ICD-10-CM | POA: Diagnosis not present

## 2017-03-08 ENCOUNTER — Ambulatory Visit (INDEPENDENT_AMBULATORY_CARE_PROVIDER_SITE_OTHER): Payer: Medicare Other | Admitting: *Deleted

## 2017-03-08 DIAGNOSIS — R55 Syncope and collapse: Secondary | ICD-10-CM | POA: Diagnosis not present

## 2017-03-08 NOTE — Progress Notes (Signed)
Carelink Summary Report / Loop Recorder 

## 2017-03-23 LAB — CUP PACEART REMOTE DEVICE CHECK
Date Time Interrogation Session: 20181128173841
MDC IDC PG IMPLANT DT: 20160112

## 2017-03-24 DIAGNOSIS — M545 Low back pain: Secondary | ICD-10-CM | POA: Diagnosis not present

## 2017-03-24 DIAGNOSIS — Z79899 Other long term (current) drug therapy: Secondary | ICD-10-CM | POA: Diagnosis not present

## 2017-04-07 ENCOUNTER — Ambulatory Visit (INDEPENDENT_AMBULATORY_CARE_PROVIDER_SITE_OTHER): Payer: Medicare Other | Admitting: *Deleted

## 2017-04-07 DIAGNOSIS — R55 Syncope and collapse: Secondary | ICD-10-CM | POA: Diagnosis not present

## 2017-04-10 NOTE — Progress Notes (Signed)
Carelink Summary Report / Loop Recorder 

## 2017-04-17 LAB — CUP PACEART REMOTE DEVICE CHECK
Date Time Interrogation Session: 20181228204009
MDC IDC PG IMPLANT DT: 20160112

## 2017-04-18 ENCOUNTER — Telehealth: Payer: Self-pay | Admitting: Cardiovascular Disease

## 2017-04-18 NOTE — Telephone Encounter (Signed)
Closed Encounter  °

## 2017-04-19 ENCOUNTER — Encounter: Payer: Medicare Other | Admitting: Cardiovascular Disease

## 2017-04-21 DIAGNOSIS — M545 Low back pain: Secondary | ICD-10-CM | POA: Diagnosis not present

## 2017-04-21 DIAGNOSIS — Z79899 Other long term (current) drug therapy: Secondary | ICD-10-CM | POA: Diagnosis not present

## 2017-04-21 DIAGNOSIS — I10 Essential (primary) hypertension: Secondary | ICD-10-CM | POA: Diagnosis not present

## 2017-04-27 DIAGNOSIS — H25013 Cortical age-related cataract, bilateral: Secondary | ICD-10-CM | POA: Diagnosis not present

## 2017-04-27 DIAGNOSIS — H43391 Other vitreous opacities, right eye: Secondary | ICD-10-CM | POA: Diagnosis not present

## 2017-04-27 DIAGNOSIS — H53143 Visual discomfort, bilateral: Secondary | ICD-10-CM | POA: Diagnosis not present

## 2017-05-05 ENCOUNTER — Ambulatory Visit (INDEPENDENT_AMBULATORY_CARE_PROVIDER_SITE_OTHER): Payer: Medicare Other | Admitting: Cardiovascular Disease

## 2017-05-05 ENCOUNTER — Encounter: Payer: Self-pay | Admitting: Cardiovascular Disease

## 2017-05-05 VITALS — BP 120/64 | HR 65 | Ht 63.0 in | Wt 183.0 lb

## 2017-05-05 DIAGNOSIS — I447 Left bundle-branch block, unspecified: Secondary | ICD-10-CM

## 2017-05-05 DIAGNOSIS — Z95818 Presence of other cardiac implants and grafts: Secondary | ICD-10-CM

## 2017-05-05 DIAGNOSIS — Z720 Tobacco use: Secondary | ICD-10-CM

## 2017-05-05 DIAGNOSIS — E7800 Pure hypercholesterolemia, unspecified: Secondary | ICD-10-CM | POA: Diagnosis not present

## 2017-05-05 DIAGNOSIS — I771 Stricture of artery: Secondary | ICD-10-CM | POA: Diagnosis not present

## 2017-05-05 DIAGNOSIS — I10 Essential (primary) hypertension: Secondary | ICD-10-CM | POA: Diagnosis not present

## 2017-05-05 DIAGNOSIS — R55 Syncope and collapse: Secondary | ICD-10-CM | POA: Diagnosis not present

## 2017-05-05 NOTE — Progress Notes (Signed)
Cardiology Office Note    Date:  05/05/2017   ID:  Haley, Johnston [birth year], MRN [id number]  PCP:  Deland Pretty, MD  Cardiologist:   Sanda Klein, MD   Chief complaint:  Palpitations, near syncope   History of Present Illness:  [name] is a 62 y.o. female chronic left bundle branch block and neurally mediated syncope. She has an implantable loop recorder in place since January 2016, which has not yet shown any significant arrhythmia.    She has not had full-blown syncope since the loop recorder was implanted but has occasional spells of feeling weak and has documented blood pressure in the 70s.  With her home monitor. She cannot identify particular triggers for these events. She feels better if she lies down.  Her loop recorder has never shown any meaningful arrhythmia.  Submitted symptomatic rhythm strips have been consistent with mild sinus tachycardia.  She has a lot of problems with swelling in her joints related to her arthritis.  She develops lower extremity edema if she does not take a diuretic.  If she takes Celebrex twice a day she will often have worsened lower extremity edema.  She usually takes it only once a day.  She has a variety of joint complaints, but no cardiovascular issues other than described above.  She does not have any symptoms of upper extremity claudication, despite a known gradient between the blood pressures (higher in the left arm compared to the right arm).  She denies exertional angina or dyspnea but is quite sedentary.  She has not had any focal neurological complaints.  She does not have claudication.  She has continued to gain weight and remains mildly obese.  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 She also has a long-standing left bundle branch block which has been documented at least since 2007. She had a normal echocardiogram in 2012. Normal nuclear stress test in 2011. Minimal  plaque in the carotids by ultrasonography in 2011 and December 2014. She has moderate right subclavian artery stenosis. The ultrasound performed in 2014 showed a stable 50-69 % in the right subclavian artery. The blood pressure gradient is 10-20 mmHg. The peak velocity in the subclavian artery was 325 cm/s (contralateral 243 cm/s).  Loop recorder was implanted in January 2016 after an episode of syncope, but it has never shows significant arrhythmia and she has not had recurrent syncope.  Presumed vasovagal mechanism.  Past Medical History:  Diagnosis Date  . Addiction, opium ([place])   . Arthritis   . Chronic back pain   . Fibromyalgia   . GERD (gastroesophageal reflux disease)   . Glaucoma   . Hypercholesteremia   . Hyperlipemia   . Hypertension   . Hypokalemia   . Hypothyroid   . Laryngitis   . LBBB (left bundle branch block)   . Osteopenia   . Osteopenia   . Psoriasis   . Sjogren's syndrome ([place])   . Stenosis of right subclavian artery ([place]) 12/22/2015    Past Surgical History:  Procedure Laterality Date  . APPENDECTOMY    . CARDIAC CATHETERIZATION  06/18/2002   normal  . CESAREAN SECTION    . LAPAROSCOPIC OVARIAN CYSTECTOMY    . LOOP RECORDER IMPLANT N/A 04/22/2014   Procedure: LOOP RECORDER IMPLANT;  Surgeon: Sanda Klein, MD;  Location: [facility];  Service: Cardiovascular;  Laterality: N/A;  . NM MYOCAR PERF WALL MOTION  07/17/2009   fixed anteroseptal defect,no  ischemia  . TONSILLECTOMY    . US ECHOCARDIOGRAPHY  08/05/2010   normal    Current Medications: Outpatient Medications Prior to Visit  Medication Sig Dispense Refill  . albuterol (PROAIR HFA) 108 (90 BASE) MCG/ACT inhaler Inhale 2 puffs into the lungs every 6 (six) hours as needed for wheezing or shortness of breath.    Marland Kitchen atorvastatin (LIPITOR) 40 MG tablet Take 40 mg by mouth daily.    . celecoxib (CELEBREX) 200 MG capsule Take 200 mg by mouth 2 (two) times daily.     . cetirizine (ZYRTEC) 10 MG tablet Take 10  mg by mouth daily as needed for allergies.   11  . Cholecalciferol (VITAMIN D) 2000 UNITS tablet Take 2,000 Units by mouth 2 (two) times daily.    . Cinnamon 500 MG capsule Take 500 mg by mouth 2 (two) times daily.    . diclofenac sodium (VOLTAREN) 1 % GEL Apply 1 application topically 3 (three) times daily as needed (pain). On hands and knees    . etanercept (ENBREL) 50 MG/ML injection Inject 50 mg into the skin 2 (two) times a week. Tuesday and Friday.    . ezetimibe (ZETIA) 10 MG tablet Take 5 mg by mouth daily.    . fluticasone (FLONASE) 50 MCG/ACT nasal spray Place into both nostrils daily.    Marland Kitchen gabapentin (NEURONTIN) 600 MG tablet Take 600 mg by mouth 4 (four) times daily.  5  . levothyroxine (SYNTHROID, LEVOTHROID) 112 MCG tablet Take 112 mcg by mouth daily before breakfast.     . lidocaine (LIDODERM) 5 % Place 1 patch onto the skin daily. Remove & Discard patch within 12 hours or as directed by MD    . loratadine (CLARITIN) 10 MG tablet Take 10 mg by mouth 2 (two) times daily.    Marland Kitchen losartan-hydrochlorothiazide (HYZAAR) 100-25 MG per tablet Take 1 tablet by mouth daily.    . methadone (DOLOPHINE) 10 MG tablet Take 10 mg by mouth 4 (four) times daily.     . Omega-3 Fatty Acids (FISH OIL) 1000 MG CAPS Take 1,000 mg by mouth 2 (two) times daily.    Marland Kitchen oxyCODONE-acetaminophen (ENDOCET) 10-325 MG per tablet Take 1 tablet by mouth 3 (three) times daily as needed for pain.    Marland Kitchen tiZANidine (ZANAFLEX) 4 MG tablet Take 4 mg by mouth 4 (four) times daily.      No facility-administered medications prior to visit.      Allergies:   Adalimumab; Arava [leflunomide]; Aspirin; Codeine; Darvocet [propoxyphene n-acetaminophen]; Fioricet [butalbital-apap-caffeine]; Levofloxacin; Sertraline hcl; and Sulfa antibiotics   Social History   Socioeconomic History  . Marital status: Divorced    Spouse name: None  . Number of children: 2  . Years of education: None  . Highest education level: None  Social  Needs  . Financial resource strain: None  . Food insecurity - worry: None  . Food insecurity - inability: None  . Transportation needs - medical: None  . Transportation needs - non-medical: None  Occupational History  . Occupation: Disabled   Tobacco Use  . Smoking status: Current Every Day Smoker    Packs/day: 0.80    Years: 45.00    Pack years: 36.00  . Smokeless tobacco: Never Used  Substance and Sexual Activity  . Alcohol use: No    Alcohol/week: 0.0 oz  . Drug use: No  . Sexual activity: None  Other Topics Concern  . None  Social History Narrative   Drinks 4-6 cups of  coffee a day and 2 sodas      Family History:  The patient's family history includes Cancer in her brother and brother; Diabetes in her father and mother; Heart failure in her father and mother; Hypertension in her brother, father, mother, and sister.   ROS:   Please see the history of present illness.    ROS All other systems reviewed and are negative.   PHYSICAL EXAM:   VS:  BP 120/64 (BP Location: Right Arm)   Pulse 65   Ht 5\' 3"  (1.6 m)   Wt 183 lb (83 kg)   BMI 32.42 kg/m     On my exam: right arm blood pressure 120/64 mmHg, left arm blood pressure 138/70 mmHg   General: Alert, oriented x3, no distress, some facial swelling Head: no evidence of trauma, PERRL, EOMI, no exophtalmos or lid lag, no myxedema, no xanthelasma; normal ears, nose and oropharynx Neck: normal jugular venous pulsations and no hepatojugular reflux; brisk carotid pulses without delay and no carotid bruits Chest: clear to auscultation, no signs of consolidation by percussion or palpation, normal fremitus, symmetrical and full respiratory excursions Cardiovascular: normal position and quality of the apical impulse, regular rhythm, normal first and paradoxically split second heart sounds, no murmurs, rubs or gallops Abdomen: no tenderness or distention, no masses by palpation, no abnormal pulsatility or arterial bruits, normal  bowel sounds, no hepatosplenomegaly Extremities: no clubbing, cyanosis or edema; 2+ radial, ulnar and brachial pulses bilaterally; 2+ right femoral, posterior tibial and dorsalis pedis pulses; 2+ left femoral, posterior tibial and dorsalis pedis pulses; no subclavian or femoral bruits.  Prominent swelling and deformity of the metacarpophalangeal joints consistent with rheumatoid arthritis Neurological: grossly nonfocal Psych: Normal mood and affect   Wt Readings from Last 3 Encounters:  05/05/17 183 lb (83 kg)  11/22/16 180 lb (81.6 kg)  11/08/16 180 lb (81.6 kg)      Studies/Labs Reviewed:   EKG:  EKG is ordered today.  The ekg ordered today demonstrates normal sinus rhythm, left bundle branch block (QRS 166 ms), an old finding.  QTc 492 ms  Recent Labs: No results found for requested labs within last 8760 hours.   Lipid Panel    Component Value Date/Time   CHOL  08/14/2008 0210    139        ATP III CLASSIFICATION:  <200     mg/dL   Desirable  200-239  mg/dL   Borderline High  >=240    mg/dL   High          TRIG 125 08/14/2008 0210   HDL 28 (L) 08/14/2008 0210   CHOLHDL 5.0 08/14/2008 0210   VLDL 25 08/14/2008 0210   LDLCALC  08/14/2008 0210    86        Total Cholesterol/HDL:CHD Risk Coronary Heart Disease Risk Table                     Men   Women  1/2 Average Risk   3.4   3.3  Average Risk       5.0   4.4  2 X Average Risk   9.6   7.1  3 X Average Risk  23.4   11.0        Use the calculated Patient Ratio above and the CHD Risk Table to determine the patient's CHD Risk.        ATP III CLASSIFICATION (LDL):  <100     mg/dL   Optimal  100-129  mg/dL   Near or Above                    Optimal  130-159  mg/dL   Borderline  160-189  mg/dL   High  >190     mg/dL   Very High      ASSESSMENT:    No diagnosis found.   PLAN:  In order of problems listed above:  1. Syncope: Her previous syncopal events were clearly consistent with neurally mediated events.   No arrhythmia has ever been detected in 3 years of invasive loop monitor recordings.  Discussed the importance of paying attention to prodromal symptoms and laying down when these occur, staying well-hydrated, avoiding extreme heat.  Unfortunately she has not been able to identify a clear trigger for her event.  She seems to have a very mild condition, with no serious problems in the last 3 years. 2. LBBB: No evidence of high-grade AV block by ECG or loop recorder.  Her older brother does have a pacemaker and its possible that she has advanced conduction system disease for her age and will eventually need a device as well. 3. Right subclavian stenosis: The difference in blood pressure between the right and left upper extremity is unchanged from a year ago, at about 20 mmHg. She does not have any symptoms of claudication. Reminded to always check her blood pressure in the left arm. 4. HLP: On combination of atorvastatin and ezetimibe, followed by Dr. Shelia Media. Ideally would try to keep LDL under 70 since she appears to have PAD  5. HTN: Blood pressure is well controlled.  She is taking a thiazide diuretic which might increase her tendency for syncope, but she states that she has to take this for she will develop severe swelling.  No changes made to her medications. 6. Tobacco abuse: Strongly encouraged to completely quit smoking. 7. ILR: The loop recorder is very close to the end of service.  She would like the device removed.  Sometimes the end of it seems to catch on her bra and it has become a nuisance.  We will schedule her for device explantation in the near future; this procedure has been fully reviewed with the patient and written informed consent has been obtained.     Medication Adjustments/Labs and Tests Ordered: Current medicines are reviewed at length with the patient today.  Concerns regarding medicines are outlined above.  Medication changes, Labs and Tests ordered today are listed in the  Patient Instructions below. Patient Instructions  Dr Sallyanne Kuster recommends that you have your loop recorder removed. This has been scheduled for February 6th, 2019. Please see your letter for additional information/instructions.  Your physician recommends that you schedule a follow-up appointment in 12 months. You will receive a reminder letter in the mail two months in advance. If you don't receive a letter, please call our office to schedule the follow-up appointment.  If you need a refill on your cardiac medications before your next appointment, please call your pharmacy.    Signed, Sanda Klein, MD  05/05/2017 12:15 PM    [address], [place], [place]  [address] Phone: [phone]; Fax: [phone]

## 2017-05-05 NOTE — Patient Instructions (Signed)
Dr Sallyanne Kuster recommends that you have your loop recorder removed. This has been scheduled for February 6th, 2019. Please see your letter for additional information/instructions.  Your physician recommends that you schedule a follow-up appointment in 12 months. You will receive a reminder letter in the mail two months in advance. If you don't receive a letter, please call our office to schedule the follow-up appointment.  If you need a refill on your cardiac medications before your next appointment, please call your pharmacy.

## 2017-05-05 NOTE — H&P (View-Only) (Signed)
Cardiology Office Note    Date:  05/05/2017   ID:  Haley, Johnston 1955/08/10, MRN [id number]  PCP:  Deland Pretty, MD  Cardiologist:   Sanda Klein, MD   Chief complaint:  Palpitations, near syncope   History of Present Illness:  [name] is a 62 y.o. female chronic left bundle branch block and neurally mediated syncope. She has an implantable loop recorder in place since January 2016, which has not yet shown any significant arrhythmia.    She has not had full-blown syncope since the loop recorder was implanted but has occasional spells of feeling weak and has documented blood pressure in the 70s.  With her home monitor. She cannot identify particular triggers for these events. She feels better if she lies down.  Her loop recorder has never shown any meaningful arrhythmia.  Submitted symptomatic rhythm strips have been consistent with mild sinus tachycardia.  She has a lot of problems with swelling in her joints related to her arthritis.  She develops lower extremity edema if she does not take a diuretic.  If she takes Celebrex twice a day she will often have worsened lower extremity edema.  She usually takes it only once a day.  She has a variety of joint complaints, but no cardiovascular issues other than described above.  She does not have any symptoms of upper extremity claudication, despite a known gradient between the blood pressures (higher in the left arm compared to the right arm).  She denies exertional angina or dyspnea but is quite sedentary.  She has not had any focal neurological complaints.  She does not have claudication.  She has continued to gain weight and remains mildly obese.  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 She also has a long-standing left bundle branch block which has been documented at least since 2007. She had a normal echocardiogram in 2012. Normal nuclear stress test in 2011. Minimal  plaque in the carotids by ultrasonography in 2011 and December 2014. She has moderate right subclavian artery stenosis. The ultrasound performed in 2014 showed a stable 50-69 % in the right subclavian artery. The blood pressure gradient is 10-20 mmHg. The peak velocity in the subclavian artery was 325 cm/s (contralateral 243 cm/s).  Loop recorder was implanted in January 2016 after an episode of syncope, but it has never shows significant arrhythmia and she has not had recurrent syncope.  Presumed vasovagal mechanism.  Past Medical History:  Diagnosis Date  . Addiction, opium ([place])   . Arthritis   . Chronic back pain   . Fibromyalgia   . GERD (gastroesophageal reflux disease)   . Glaucoma   . Hypercholesteremia   . Hyperlipemia   . Hypertension   . Hypokalemia   . Hypothyroid   . Laryngitis   . LBBB (left bundle branch block)   . Osteopenia   . Osteopenia   . Psoriasis   . Sjogren's syndrome ([place])   . Stenosis of right subclavian artery ([place]) 12/22/2015    Past Surgical History:  Procedure Laterality Date  . APPENDECTOMY    . CARDIAC CATHETERIZATION  06/18/2002   normal  . CESAREAN SECTION    . LAPAROSCOPIC OVARIAN CYSTECTOMY    . LOOP RECORDER IMPLANT N/A 04/22/2014   Procedure: LOOP RECORDER IMPLANT;  Surgeon: Sanda Klein, MD;  Location: [facility];  Service: Cardiovascular;  Laterality: N/A;  . NM MYOCAR PERF WALL MOTION  07/17/2009   fixed anteroseptal defect,no  ischemia  . TONSILLECTOMY    . US ECHOCARDIOGRAPHY  08/05/2010   normal    Current Medications: Outpatient Medications Prior to Visit  Medication Sig Dispense Refill  . albuterol (PROAIR HFA) 108 (90 BASE) MCG/ACT inhaler Inhale 2 puffs into the lungs every 6 (six) hours as needed for wheezing or shortness of breath.    Marland Kitchen atorvastatin (LIPITOR) 40 MG tablet Take 40 mg by mouth daily.    . celecoxib (CELEBREX) 200 MG capsule Take 200 mg by mouth 2 (two) times daily.     . cetirizine (ZYRTEC) 10 MG tablet Take 10  mg by mouth daily as needed for allergies.   11  . Cholecalciferol (VITAMIN D) 2000 UNITS tablet Take 2,000 Units by mouth 2 (two) times daily.    . Cinnamon 500 MG capsule Take 500 mg by mouth 2 (two) times daily.    . diclofenac sodium (VOLTAREN) 1 % GEL Apply 1 application topically 3 (three) times daily as needed (pain). On hands and knees    . etanercept (ENBREL) 50 MG/ML injection Inject 50 mg into the skin 2 (two) times a week. Tuesday and Friday.    . ezetimibe (ZETIA) 10 MG tablet Take 5 mg by mouth daily.    . fluticasone (FLONASE) 50 MCG/ACT nasal spray Place into both nostrils daily.    Marland Kitchen gabapentin (NEURONTIN) 600 MG tablet Take 600 mg by mouth 4 (four) times daily.  5  . levothyroxine (SYNTHROID, LEVOTHROID) 112 MCG tablet Take 112 mcg by mouth daily before breakfast.     . lidocaine (LIDODERM) 5 % Place 1 patch onto the skin daily. Remove & Discard patch within 12 hours or as directed by MD    . loratadine (CLARITIN) 10 MG tablet Take 10 mg by mouth 2 (two) times daily.    Marland Kitchen losartan-hydrochlorothiazide (HYZAAR) 100-25 MG per tablet Take 1 tablet by mouth daily.    . methadone (DOLOPHINE) 10 MG tablet Take 10 mg by mouth 4 (four) times daily.     . Omega-3 Fatty Acids (FISH OIL) 1000 MG CAPS Take 1,000 mg by mouth 2 (two) times daily.    Marland Kitchen oxyCODONE-acetaminophen (ENDOCET) 10-325 MG per tablet Take 1 tablet by mouth 3 (three) times daily as needed for pain.    Marland Kitchen tiZANidine (ZANAFLEX) 4 MG tablet Take 4 mg by mouth 4 (four) times daily.      No facility-administered medications prior to visit.      Allergies:   Adalimumab; Arava [leflunomide]; Aspirin; Codeine; Darvocet [propoxyphene n-acetaminophen]; Fioricet [butalbital-apap-caffeine]; Levofloxacin; Sertraline hcl; and Sulfa antibiotics   Social History   Socioeconomic History  . Marital status: Divorced    Spouse name: None  . Number of children: 2  . Years of education: None  . Highest education level: None  Social  Needs  . Financial resource strain: None  . Food insecurity - worry: None  . Food insecurity - inability: None  . Transportation needs - medical: None  . Transportation needs - non-medical: None  Occupational History  . Occupation: Disabled   Tobacco Use  . Smoking status: Current Every Day Smoker    Packs/day: 0.80    Years: 45.00    Pack years: 36.00  . Smokeless tobacco: Never Used  Substance and Sexual Activity  . Alcohol use: No    Alcohol/week: 0.0 oz  . Drug use: No  . Sexual activity: None  Other Topics Concern  . None  Social History Narrative   Drinks 4-6 cups of  coffee a day and 2 sodas      Family History:  The patient's family history includes Cancer in her brother and brother; Diabetes in her father and mother; Heart failure in her father and mother; Hypertension in her brother, father, mother, and sister.   ROS:   Please see the history of present illness.    ROS All other systems reviewed and are negative.   PHYSICAL EXAM:   VS:  BP 120/64 (BP Location: Right Arm)   Pulse 65   Ht 5\' 3"  (1.6 m)   Wt 183 lb (83 kg)   BMI 32.42 kg/m     On my exam: right arm blood pressure 120/64 mmHg, left arm blood pressure 138/70 mmHg   General: Alert, oriented x3, no distress, some facial swelling Head: no evidence of trauma, PERRL, EOMI, no exophtalmos or lid lag, no myxedema, no xanthelasma; normal ears, nose and oropharynx Neck: normal jugular venous pulsations and no hepatojugular reflux; brisk carotid pulses without delay and no carotid bruits Chest: clear to auscultation, no signs of consolidation by percussion or palpation, normal fremitus, symmetrical and full respiratory excursions Cardiovascular: normal position and quality of the apical impulse, regular rhythm, normal first and paradoxically split second heart sounds, no murmurs, rubs or gallops Abdomen: no tenderness or distention, no masses by palpation, no abnormal pulsatility or arterial bruits, normal  bowel sounds, no hepatosplenomegaly Extremities: no clubbing, cyanosis or edema; 2+ radial, ulnar and brachial pulses bilaterally; 2+ right femoral, posterior tibial and dorsalis pedis pulses; 2+ left femoral, posterior tibial and dorsalis pedis pulses; no subclavian or femoral bruits.  Prominent swelling and deformity of the metacarpophalangeal joints consistent with rheumatoid arthritis Neurological: grossly nonfocal Psych: Normal mood and affect   Wt Readings from Last 3 Encounters:  05/05/17 183 lb (83 kg)  11/22/16 180 lb (81.6 kg)  11/08/16 180 lb (81.6 kg)      Studies/Labs Reviewed:   EKG:  EKG is ordered today.  The ekg ordered today demonstrates normal sinus rhythm, left bundle branch block (QRS 166 ms), an old finding.  QTc 492 ms  Recent Labs: No results found for requested labs within last 8760 hours.   Lipid Panel    Component Value Date/Time   CHOL  08/14/2008 0210    139        ATP III CLASSIFICATION:  <200     mg/dL   Desirable  200-239  mg/dL   Borderline High  >=240    mg/dL   High          TRIG 125 08/14/2008 0210   HDL 28 (L) 08/14/2008 0210   CHOLHDL 5.0 08/14/2008 0210   VLDL 25 08/14/2008 0210   LDLCALC  08/14/2008 0210    86        Total Cholesterol/HDL:CHD Risk Coronary Heart Disease Risk Table                     Men   Women  1/2 Average Risk   3.4   3.3  Average Risk       5.0   4.4  2 X Average Risk   9.6   7.1  3 X Average Risk  23.4   11.0        Use the calculated Patient Ratio above and the CHD Risk Table to determine the patient's CHD Risk.        ATP III CLASSIFICATION (LDL):  <100     mg/dL   Optimal  100-129  mg/dL   Near or Above                    Optimal  130-159  mg/dL   Borderline  160-189  mg/dL   High  >190     mg/dL   Very High      ASSESSMENT:    No diagnosis found.   PLAN:  In order of problems listed above:  1. Syncope: Her previous syncopal events were clearly consistent with neurally mediated events.   No arrhythmia has ever been detected in 3 years of invasive loop monitor recordings.  Discussed the importance of paying attention to prodromal symptoms and laying down when these occur, staying well-hydrated, avoiding extreme heat.  Unfortunately she has not been able to identify a clear trigger for her event.  She seems to have a very mild condition, with no serious problems in the last 3 years. 2. LBBB: No evidence of high-grade AV block by ECG or loop recorder.  Her older brother does have a pacemaker and its possible that she has advanced conduction system disease for her age and will eventually need a device as well. 3. Right subclavian stenosis: The difference in blood pressure between the right and left upper extremity is unchanged from a year ago, at about 20 mmHg. She does not have any symptoms of claudication. Reminded to always check her blood pressure in the left arm. 4. HLP: On combination of atorvastatin and ezetimibe, followed by Dr. Shelia Media. Ideally would try to keep LDL under 70 since she appears to have PAD  5. HTN: Blood pressure is well controlled.  She is taking a thiazide diuretic which might increase her tendency for syncope, but she states that she has to take this for she will develop severe swelling.  No changes made to her medications. 6. Tobacco abuse: Strongly encouraged to completely quit smoking. 7. ILR: The loop recorder is very close to the end of service.  She would like the device removed.  Sometimes the end of it seems to catch on her bra and it has become a nuisance.  We will schedule her for device explantation in the near future; this procedure has been fully reviewed with the patient and written informed consent has been obtained.     Medication Adjustments/Labs and Tests Ordered: Current medicines are reviewed at length with the patient today.  Concerns regarding medicines are outlined above.  Medication changes, Labs and Tests ordered today are listed in the  Patient Instructions below. Patient Instructions  Dr Sallyanne Kuster recommends that you have your loop recorder removed. This has been scheduled for February 6th, 2019. Please see your letter for additional information/instructions.  Your physician recommends that you schedule a follow-up appointment in 12 months. You will receive a reminder letter in the mail two months in advance. If you don't receive a letter, please call our office to schedule the follow-up appointment.  If you need a refill on your cardiac medications before your next appointment, please call your pharmacy.    Signed, Sanda Klein, MD  05/05/2017 12:15 PM    [address], [place], [place]  [address] Phone: [phone]; Fax: [phone]

## 2017-05-08 ENCOUNTER — Ambulatory Visit (INDEPENDENT_AMBULATORY_CARE_PROVIDER_SITE_OTHER): Payer: Medicare Other | Admitting: *Deleted

## 2017-05-08 ENCOUNTER — Ambulatory Visit (INDEPENDENT_AMBULATORY_CARE_PROVIDER_SITE_OTHER)
Admission: RE | Admit: 2017-05-08 | Discharge: 2017-05-08 | Disposition: A | Discharge status: Home / Self Care | Payer: Medicare Other | Source: Ambulatory Visit | Attending: Acute Care | Admitting: Acute Care

## 2017-05-08 DIAGNOSIS — R55 Syncope and collapse: Secondary | ICD-10-CM

## 2017-05-08 DIAGNOSIS — F17210 Nicotine dependence, cigarettes, uncomplicated: Secondary | ICD-10-CM

## 2017-05-08 NOTE — Progress Notes (Signed)
Carelink Summary Report / Loop Recorder 

## 2017-05-12 ENCOUNTER — Other Ambulatory Visit: Payer: Self-pay | Admitting: Acute Care

## 2017-05-12 DIAGNOSIS — Z122 Encounter for screening for malignant neoplasm of respiratory organs: Secondary | ICD-10-CM

## 2017-05-12 DIAGNOSIS — F17210 Nicotine dependence, cigarettes, uncomplicated: Secondary | ICD-10-CM

## 2017-05-16 DIAGNOSIS — Z79899 Other long term (current) drug therapy: Secondary | ICD-10-CM | POA: Diagnosis not present

## 2017-05-16 DIAGNOSIS — M797 Fibromyalgia: Secondary | ICD-10-CM | POA: Diagnosis not present

## 2017-05-16 DIAGNOSIS — R768 Other specified abnormal immunological findings in serum: Secondary | ICD-10-CM | POA: Diagnosis not present

## 2017-05-16 DIAGNOSIS — M150 Primary generalized (osteo)arthritis: Secondary | ICD-10-CM | POA: Diagnosis not present

## 2017-05-16 DIAGNOSIS — L4050 Arthropathic psoriasis, unspecified: Secondary | ICD-10-CM | POA: Diagnosis not present

## 2017-05-17 ENCOUNTER — Encounter (HOSPITAL_COMMUNITY)
Admission: RE | Disposition: A | Discharge status: Home / Self Care | Payer: Self-pay | Source: Ambulatory Visit | Attending: Cardiovascular Disease

## 2017-05-17 ENCOUNTER — Ambulatory Visit (HOSPITAL_COMMUNITY)
Admission: RE | Admit: 2017-05-17 | Discharge: 2017-05-17 | Disposition: A | Discharge status: Home / Self Care | Payer: Medicare Other | Source: Ambulatory Visit | Attending: Cardiovascular Disease | Admitting: Cardiovascular Disease

## 2017-05-17 DIAGNOSIS — M797 Fibromyalgia: Secondary | ICD-10-CM | POA: Insufficient documentation

## 2017-05-17 DIAGNOSIS — Z8249 Family history of ischemic heart disease and other diseases of the circulatory system: Secondary | ICD-10-CM | POA: Diagnosis not present

## 2017-05-17 DIAGNOSIS — M1990 Unspecified osteoarthritis, unspecified site: Secondary | ICD-10-CM | POA: Insufficient documentation

## 2017-05-17 DIAGNOSIS — R55 Syncope and collapse: Secondary | ICD-10-CM | POA: Insufficient documentation

## 2017-05-17 DIAGNOSIS — E7800 Pure hypercholesterolemia, unspecified: Secondary | ICD-10-CM | POA: Diagnosis not present

## 2017-05-17 DIAGNOSIS — E039 Hypothyroidism, unspecified: Secondary | ICD-10-CM | POA: Diagnosis not present

## 2017-05-17 DIAGNOSIS — F17210 Nicotine dependence, cigarettes, uncomplicated: Secondary | ICD-10-CM | POA: Insufficient documentation

## 2017-05-17 DIAGNOSIS — Z4509 Encounter for adjustment and management of other cardiac device: Secondary | ICD-10-CM

## 2017-05-17 DIAGNOSIS — I447 Left bundle-branch block, unspecified: Secondary | ICD-10-CM | POA: Diagnosis not present

## 2017-05-17 DIAGNOSIS — Z6832 Body mass index (BMI) 32.0-32.9, adult: Secondary | ICD-10-CM | POA: Diagnosis not present

## 2017-05-17 DIAGNOSIS — K219 Gastro-esophageal reflux disease without esophagitis: Secondary | ICD-10-CM | POA: Diagnosis not present

## 2017-05-17 DIAGNOSIS — Z885 Allergy status to narcotic agent status: Secondary | ICD-10-CM | POA: Insufficient documentation

## 2017-05-17 DIAGNOSIS — E669 Obesity, unspecified: Secondary | ICD-10-CM | POA: Insufficient documentation

## 2017-05-17 DIAGNOSIS — H409 Unspecified glaucoma: Secondary | ICD-10-CM | POA: Diagnosis not present

## 2017-05-17 DIAGNOSIS — G8929 Other chronic pain: Secondary | ICD-10-CM | POA: Diagnosis not present

## 2017-05-17 DIAGNOSIS — Z7951 Long term (current) use of inhaled steroids: Secondary | ICD-10-CM | POA: Insufficient documentation

## 2017-05-17 DIAGNOSIS — Z882 Allergy status to sulfonamides status: Secondary | ICD-10-CM | POA: Diagnosis not present

## 2017-05-17 DIAGNOSIS — I10 Essential (primary) hypertension: Secondary | ICD-10-CM | POA: Diagnosis not present

## 2017-05-17 DIAGNOSIS — M3500 Sicca syndrome, unspecified: Secondary | ICD-10-CM | POA: Diagnosis not present

## 2017-05-17 DIAGNOSIS — M8580 Other specified disorders of bone density and structure, unspecified site: Secondary | ICD-10-CM | POA: Insufficient documentation

## 2017-05-17 DIAGNOSIS — M549 Dorsalgia, unspecified: Secondary | ICD-10-CM | POA: Diagnosis not present

## 2017-05-17 HISTORY — PX: LOOP RECORDER REMOVAL: EP1215

## 2017-05-17 SURGERY — LOOP RECORDER REMOVAL

## 2017-05-17 MED ORDER — LIDOCAINE HCL (PF) 1 % IJ SOLN
INTRAMUSCULAR | Status: DC | PRN
  Administered 2017-05-17: 5 mL

## 2017-05-17 MED ORDER — LIDOCAINE-EPINEPHRINE 1 %-1:100000 IJ SOLN
INTRAMUSCULAR | Status: AC
  Filled 2017-05-17: qty 1

## 2017-05-17 SURGICAL SUPPLY — 1 items: PACK LOOP INSERTION (CUSTOM PROCEDURE TRAY) ×2 IMPLANT

## 2017-05-17 NOTE — Discharge Instructions (Signed)
Supplemental Discharge Instructions for  Pacemaker/Defibrillator Patients  Activity No restrictions.  WOUND CARE - Keep the wound area clean and dry.  Remove the dressing the day after you return home (usually 48 hours after the procedure). - DO NOT SUBMERGE UNDER WATER UNTIL FULLY HEALED (no tub baths, hot tubs, swimming pools, etc.).  - You  may shower or take a sponge bath after the dressing is removed. DO NOT SOAK the area and do not allow the shower to directly spray on the site. - If you have staples, these will be removed in the office in 7-14 days. - If you have tape/steri-strips on your wound, these will fall off; do not pull them off prematurely.   - No bandage is needed on the site.  DO  NOT apply any creams, oils, or ointments to the wound area. - If you notice any drainage or discharge from the wound, any swelling, excessive redness or bruising at the site, or if you develop a fever > 101? F after you are discharged home, call the office at once.

## 2017-05-17 NOTE — Interval H&P Note (Signed)
History and Physical Interval Note:  05/17/2017 12:22 PM  [name]  has presented today for surgery, with the diagnosis of battery depletion  The various methods of treatment have been discussed with the patient and family. After consideration of risks, benefits and other options for treatment, the patient has consented to  Procedure(s): LOOP RECORDER REMOVAL (N/A) as a surgical intervention .  The patient's history has been reviewed, patient examined, no change in status, stable for surgery.  I have reviewed the patient's chart and labs.  Questions were answered to the patient's satisfaction.     [name]

## 2017-05-17 NOTE — Op Note (Signed)
LOOP RECORDER EXPLANT   Procedure report  Procedure performed:  Loop recorder Explantation   Reason for procedure:  1. Recurrent syncope/near-syncope 2. Device at end of service Procedure performed by:  Sanda Klein, MD  Complications:  None  Estimated blood loss:  <5 mL  Medications administered during procedure:  Lidocaine 1% with 1/10,000 epinephrine 10 mL locally Device details:  Medtronic Reveal Linq model number [id number] Procedure details:  After the risks and benefits of the procedure were discussed the patient provided informed consent. The patient was prepped and draped in usual sterile fashion. Local anesthesia was administered to an area 2 cm to the left of the sternum in the 4th intercostal space. A cutaneous incision was made using a scalpel and the device was easily extracted with a hemostat. Local pressure was held to ensure hemostasis.  The incision was closed with SteriStrips and a sterile dressing was applied.   Sanda Klein, MD, [place] [phone] office [phone] pager 05/17/2017 1:53 PM

## 2017-05-18 ENCOUNTER — Encounter (HOSPITAL_COMMUNITY): Payer: Self-pay | Admitting: Cardiovascular Disease

## 2017-05-18 LAB — CUP PACEART REMOTE DEVICE CHECK
Implantable Pulse Generator Implant Date: 20160112
MDC IDC SESS DTM: 20190127211028

## 2017-05-19 DIAGNOSIS — Z79899 Other long term (current) drug therapy: Secondary | ICD-10-CM | POA: Diagnosis not present

## 2017-05-19 DIAGNOSIS — I10 Essential (primary) hypertension: Secondary | ICD-10-CM | POA: Diagnosis not present

## 2017-05-19 DIAGNOSIS — M545 Low back pain: Secondary | ICD-10-CM | POA: Diagnosis not present

## 2017-05-24 ENCOUNTER — Telehealth: Payer: Self-pay | Admitting: Cardiology

## 2017-05-24 NOTE — Telephone Encounter (Signed)
Spoke w/ pt and requested that she send a manual transmission b/c her home monitor has not updated in at least 14 days.   

## 2017-05-25 ENCOUNTER — Telehealth: Payer: Self-pay | Admitting: Cardiovascular Disease

## 2017-05-25 NOTE — Telephone Encounter (Signed)
Closed encounter °

## 2017-05-29 ENCOUNTER — Ambulatory Visit: Payer: Medicare Other

## 2017-05-30 DIAGNOSIS — Z79899 Other long term (current) drug therapy: Secondary | ICD-10-CM | POA: Diagnosis not present

## 2017-05-30 DIAGNOSIS — M545 Low back pain: Secondary | ICD-10-CM | POA: Diagnosis not present

## 2017-06-02 ENCOUNTER — Telehealth: Payer: Self-pay | Admitting: Cardiovascular Disease

## 2017-06-02 NOTE — Telephone Encounter (Signed)
New message ° °Pt verbalized that she is calling for the RN °

## 2017-06-02 NOTE — Telephone Encounter (Signed)
Returned call to pt she states that back in January she had a CT of the lungs done for cancer purposes and it has some cardiac findings on this and they told her that they would forward to Dr Sallyanne Kuster for his input for the cardiac findings and she has not heard from him regarding the CT and was wondering if he wants her to schedule appt ar what he would like to do? Please advise

## 2017-06-03 NOTE — Telephone Encounter (Signed)
The findings are neither new, nor unexpected. Just shows that she has calcium in her coronaries. This is already known based on previous heart catheterization. We are already addressing is by having her on lipid lowering medicines, treating blood pressure, avoiding smoking, Etc. No additional testing is necessary as long as she does not have symptoms of CAD(angina). MCr

## 2017-06-05 DIAGNOSIS — I10 Essential (primary) hypertension: Secondary | ICD-10-CM | POA: Diagnosis not present

## 2017-06-05 DIAGNOSIS — M5441 Lumbago with sciatica, right side: Secondary | ICD-10-CM | POA: Diagnosis not present

## 2017-06-05 DIAGNOSIS — G8929 Other chronic pain: Secondary | ICD-10-CM | POA: Diagnosis not present

## 2017-06-05 NOTE — Telephone Encounter (Signed)
PT NOTIFIED SHE IS THANKFUL FOR THE CALL

## 2017-06-09 ENCOUNTER — Ambulatory Visit: Payer: Medicare Other | Admitting: *Deleted

## 2017-06-09 DIAGNOSIS — R55 Syncope and collapse: Secondary | ICD-10-CM | POA: Diagnosis not present

## 2017-06-12 NOTE — Progress Notes (Signed)
Carelink Summary Report / Loop Recorder 

## 2017-06-27 DIAGNOSIS — I10 Essential (primary) hypertension: Secondary | ICD-10-CM | POA: Diagnosis not present

## 2017-06-27 DIAGNOSIS — Z79899 Other long term (current) drug therapy: Secondary | ICD-10-CM | POA: Diagnosis not present

## 2017-06-27 DIAGNOSIS — G8929 Other chronic pain: Secondary | ICD-10-CM | POA: Diagnosis not present

## 2017-06-27 DIAGNOSIS — M5441 Lumbago with sciatica, right side: Secondary | ICD-10-CM | POA: Diagnosis not present

## 2017-07-05 DIAGNOSIS — R05 Cough: Secondary | ICD-10-CM | POA: Diagnosis not present

## 2017-07-05 DIAGNOSIS — R5383 Other fatigue: Secondary | ICD-10-CM | POA: Diagnosis not present

## 2017-07-05 DIAGNOSIS — J310 Chronic rhinitis: Secondary | ICD-10-CM | POA: Diagnosis not present

## 2017-07-05 DIAGNOSIS — R350 Frequency of micturition: Secondary | ICD-10-CM | POA: Diagnosis not present

## 2017-07-12 ENCOUNTER — Encounter: Payer: Medicare Other | Admitting: *Deleted

## 2017-07-12 DIAGNOSIS — E7800 Pure hypercholesterolemia, unspecified: Secondary | ICD-10-CM | POA: Diagnosis not present

## 2017-07-12 DIAGNOSIS — R42 Dizziness and giddiness: Secondary | ICD-10-CM | POA: Diagnosis not present

## 2017-07-12 DIAGNOSIS — J449 Chronic obstructive pulmonary disease, unspecified: Secondary | ICD-10-CM | POA: Diagnosis not present

## 2017-07-12 DIAGNOSIS — I10 Essential (primary) hypertension: Secondary | ICD-10-CM | POA: Diagnosis not present

## 2017-07-12 DIAGNOSIS — R51 Headache: Secondary | ICD-10-CM | POA: Diagnosis not present

## 2017-07-12 DIAGNOSIS — R079 Chest pain, unspecified: Secondary | ICD-10-CM | POA: Diagnosis not present

## 2017-07-27 DIAGNOSIS — Z79899 Other long term (current) drug therapy: Secondary | ICD-10-CM | POA: Diagnosis not present

## 2017-07-27 DIAGNOSIS — M5441 Lumbago with sciatica, right side: Secondary | ICD-10-CM | POA: Diagnosis not present

## 2017-07-27 DIAGNOSIS — G8929 Other chronic pain: Secondary | ICD-10-CM | POA: Diagnosis not present

## 2017-08-14 DIAGNOSIS — Z79899 Other long term (current) drug therapy: Secondary | ICD-10-CM | POA: Diagnosis not present

## 2017-08-25 DIAGNOSIS — G43909 Migraine, unspecified, not intractable, without status migrainosus: Secondary | ICD-10-CM | POA: Diagnosis not present

## 2017-08-25 DIAGNOSIS — G8929 Other chronic pain: Secondary | ICD-10-CM | POA: Diagnosis not present

## 2017-08-25 DIAGNOSIS — Z79899 Other long term (current) drug therapy: Secondary | ICD-10-CM | POA: Diagnosis not present

## 2017-08-25 DIAGNOSIS — M5441 Lumbago with sciatica, right side: Secondary | ICD-10-CM | POA: Diagnosis not present

## 2017-09-01 DIAGNOSIS — H2513 Age-related nuclear cataract, bilateral: Secondary | ICD-10-CM | POA: Diagnosis not present

## 2017-09-01 DIAGNOSIS — T7840XA Allergy, unspecified, initial encounter: Secondary | ICD-10-CM | POA: Diagnosis not present

## 2017-09-01 DIAGNOSIS — H524 Presbyopia: Secondary | ICD-10-CM | POA: Diagnosis not present

## 2017-09-09 IMAGING — CT CT CHEST LUNG CANCER SCREENING LOW DOSE W/O CM
2 of 4 series · 15 of 40 positions shown, 18 images · non-contrast
Comparison: Chest radiograph 07/31/2015.  No prior chest CT.

CLINICAL DATA: Thirty-six pack-year smoking history. Asymptomatic.
Current smoker.

EXAM:
CT CHEST WITHOUT CONTRAST LOW-DOSE FOR LUNG CANCER SCREENING
TECHNIQUE: Multidetector CT imaging of the chest was performed following the
standard protocol without IV contrast.

[Series 2: thorax 5.0 i31f 3 · axial · 0.77mm/px · z∈[-291,-41]mm · 12 of 60 slices shown, 15 images]
[im 5/60  mediastinal]
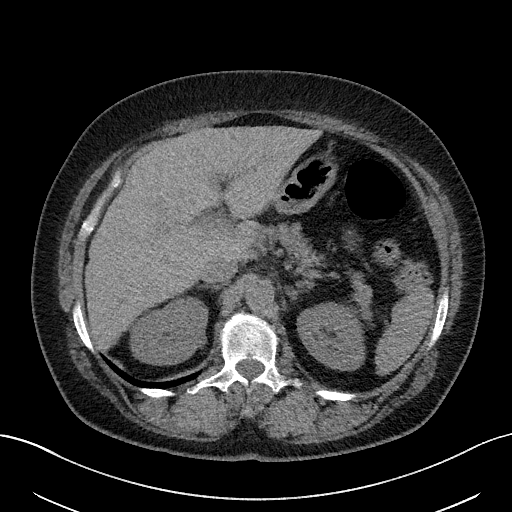
[im 5/60  lung]
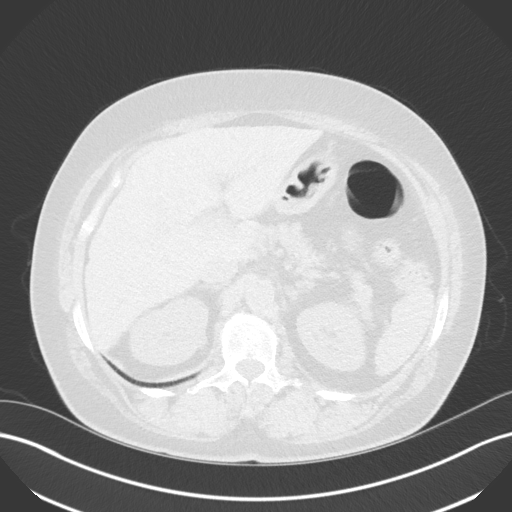
[im 10/60  lung]
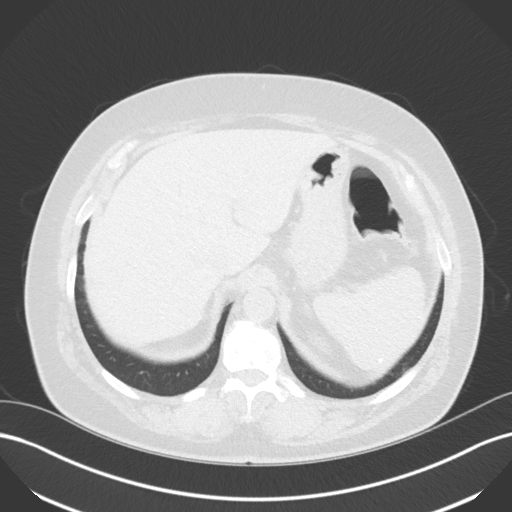
[im 14/60  lung]
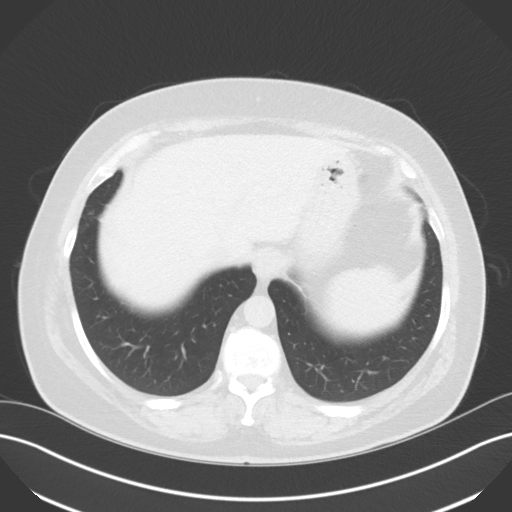
[im 19/60  lung]
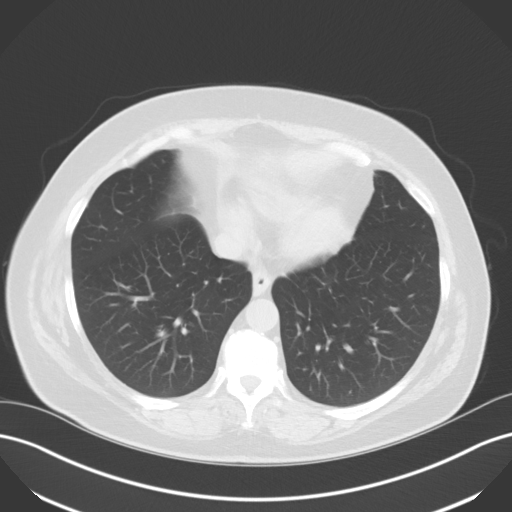
[im 23/60  mediastinal]
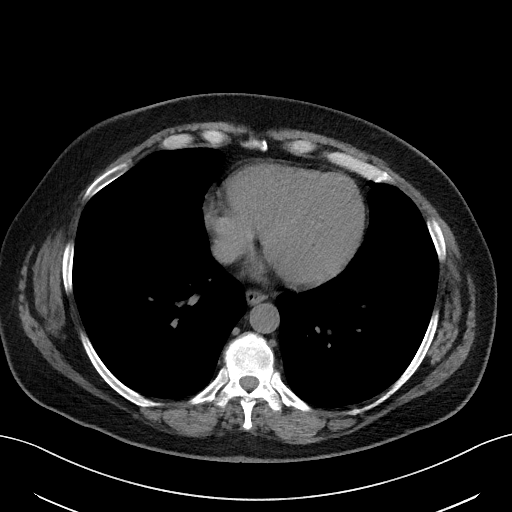
[im 23/60  lung]
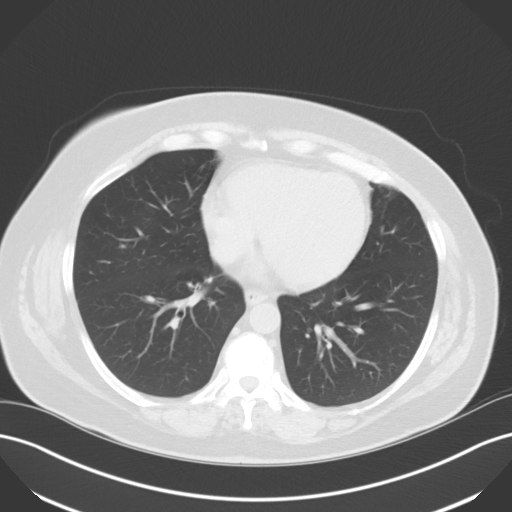
[im 28/60  lung]
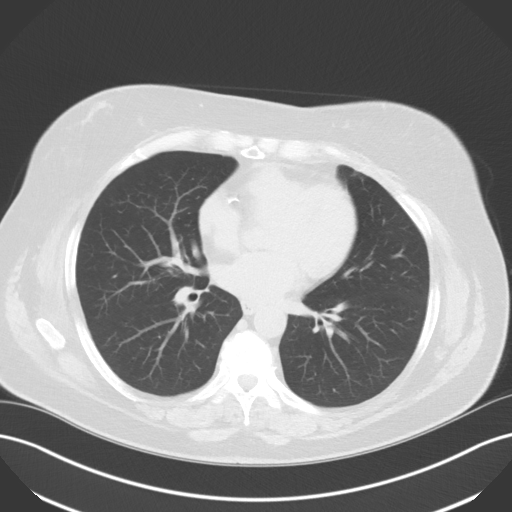
[im 32/60  lung]
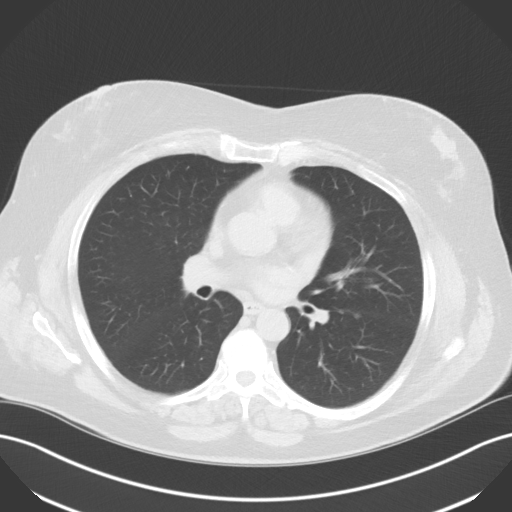
[im 37/60  lung]
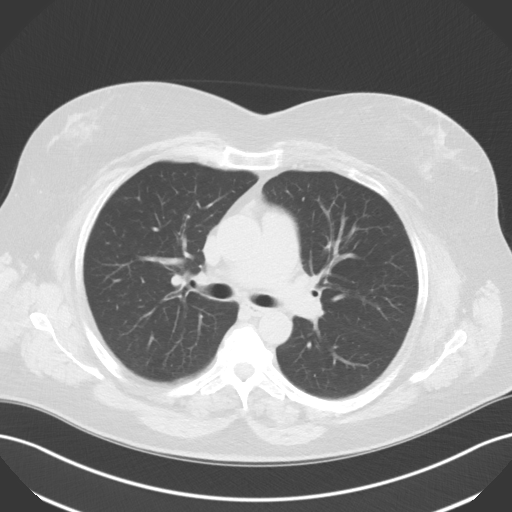
[im 41/60  mediastinal]
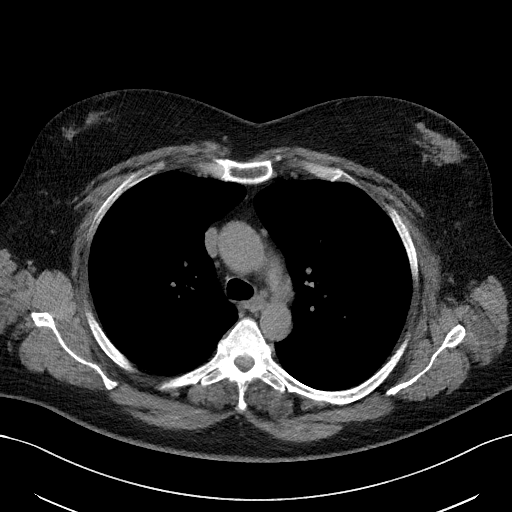
[im 41/60  lung]
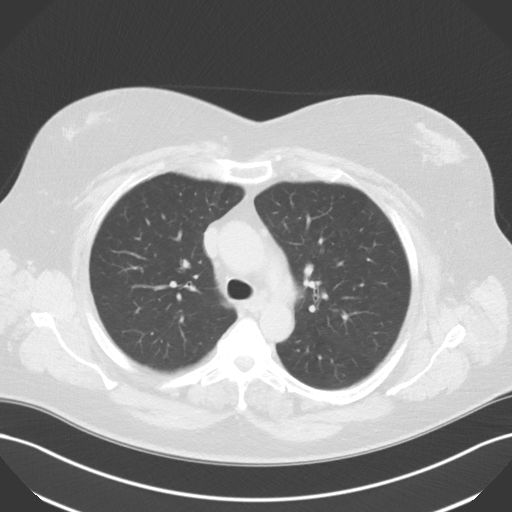
[im 46/60  lung]
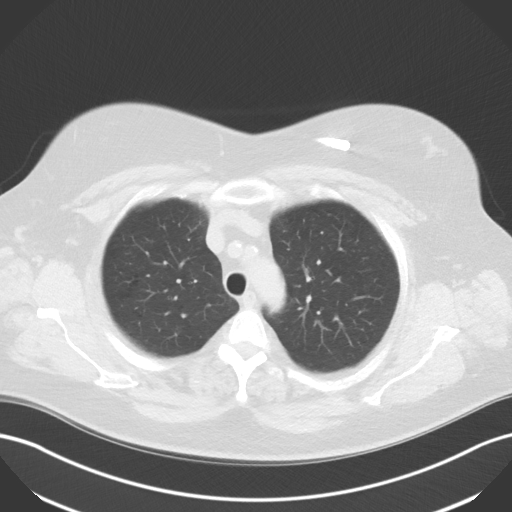
[im 50/60  lung]
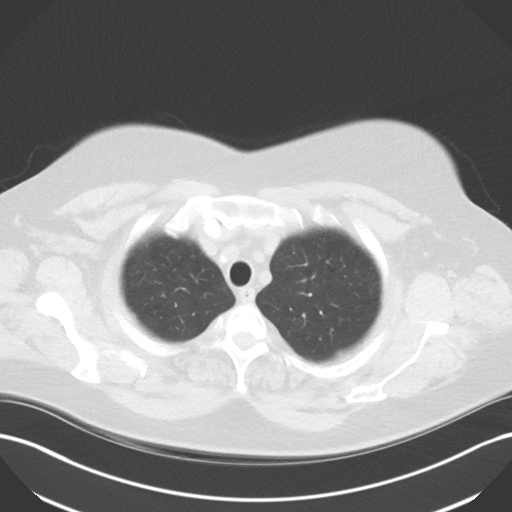
[im 55/60  lung]
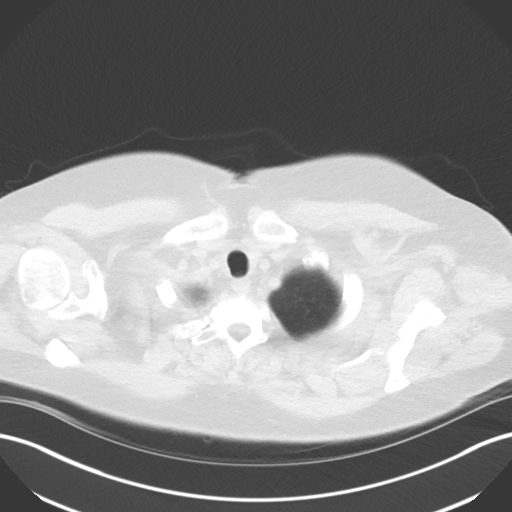

[Series 5: coronal · coronal · 0.59mm/px · 3 of 129 slices shown]
[im 26/129  lung]
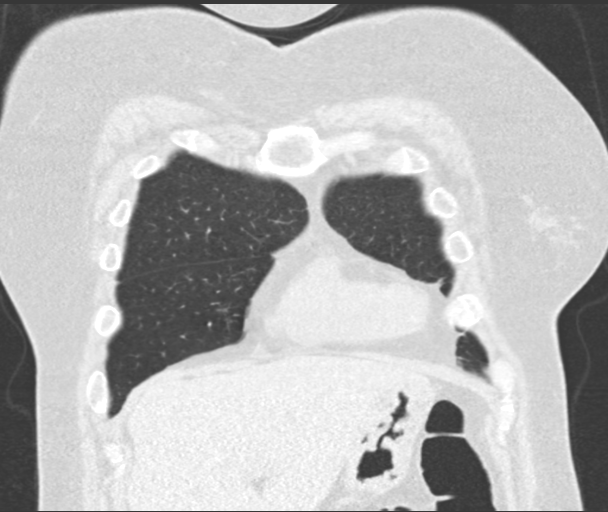
[im 52/129  lung]
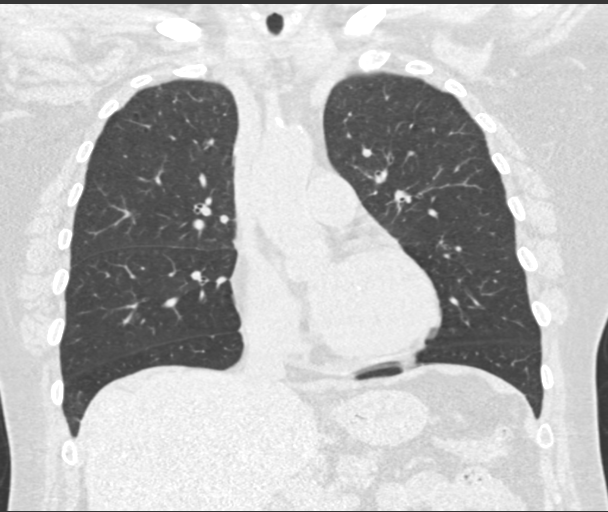
[im 77/129  lung]
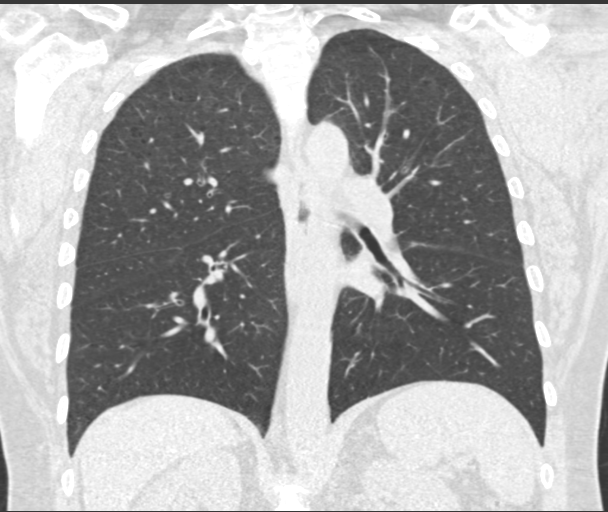

[15 of 40 positions shown; findings below may reference images not displayed]

FINDINGS: Cardiovascular: Aortic and branch vessel atherosclerosis. Mild
cardiomegaly. Multivessel coronary artery atherosclerosis. Loop
recorder identified in the left chest wall.

Mediastinum/Nodes: No mediastinal or definite hilar adenopathy,
given limitations of unenhanced CT.

Lungs/Pleura: No pleural fluid.  Mild centrilobular emphysema.

A nodule along the left major fissure measures volume derived
equivalent diameter 4.7 mm, including on image 139/ series 3.

Upper Abdomen: Normal imaged portions of the liver, stomach,
pancreas, gallbladder, adrenal glands, left kidney. An interpolar
right renal lesion is too small to characterize. Old granulomatous
disease in the spleen.

Musculoskeletal: No acute osseous abnormality.
IMPRESSION: 1. Lung-RADS Category 2, benign appearance or behavior. Continue
annual screening with low-dose chest CT without contrast in 12
months.
2. Age advanced coronary artery atherosclerosis. Recommend
assessment of coronary risk factors and consideration of medical
therapy.

## 2017-09-15 DIAGNOSIS — N2889 Other specified disorders of kidney and ureter: Secondary | ICD-10-CM | POA: Diagnosis not present

## 2017-09-15 DIAGNOSIS — C642 Malignant neoplasm of left kidney, except renal pelvis: Secondary | ICD-10-CM | POA: Diagnosis not present

## 2017-09-21 DIAGNOSIS — C642 Malignant neoplasm of left kidney, except renal pelvis: Secondary | ICD-10-CM | POA: Diagnosis not present

## 2017-09-21 DIAGNOSIS — N281 Cyst of kidney, acquired: Secondary | ICD-10-CM | POA: Diagnosis not present

## 2017-09-22 DIAGNOSIS — Z79899 Other long term (current) drug therapy: Secondary | ICD-10-CM | POA: Diagnosis not present

## 2017-09-22 DIAGNOSIS — G8929 Other chronic pain: Secondary | ICD-10-CM | POA: Diagnosis not present

## 2017-09-22 DIAGNOSIS — I10 Essential (primary) hypertension: Secondary | ICD-10-CM | POA: Diagnosis not present

## 2017-09-22 DIAGNOSIS — M5441 Lumbago with sciatica, right side: Secondary | ICD-10-CM | POA: Diagnosis not present

## 2017-10-05 DIAGNOSIS — N2889 Other specified disorders of kidney and ureter: Secondary | ICD-10-CM | POA: Diagnosis not present

## 2017-10-05 DIAGNOSIS — E119 Type 2 diabetes mellitus without complications: Secondary | ICD-10-CM | POA: Diagnosis not present

## 2017-10-05 DIAGNOSIS — R490 Dysphonia: Secondary | ICD-10-CM | POA: Diagnosis not present

## 2017-10-20 DIAGNOSIS — M5441 Lumbago with sciatica, right side: Secondary | ICD-10-CM | POA: Diagnosis not present

## 2017-10-20 DIAGNOSIS — Z6830 Body mass index (BMI) 30.0-30.9, adult: Secondary | ICD-10-CM | POA: Diagnosis not present

## 2017-10-20 DIAGNOSIS — G8929 Other chronic pain: Secondary | ICD-10-CM | POA: Diagnosis not present

## 2017-10-20 DIAGNOSIS — Z79899 Other long term (current) drug therapy: Secondary | ICD-10-CM | POA: Diagnosis not present

## 2017-11-06 DIAGNOSIS — N289 Disorder of kidney and ureter, unspecified: Secondary | ICD-10-CM | POA: Diagnosis not present

## 2017-11-06 DIAGNOSIS — N281 Cyst of kidney, acquired: Secondary | ICD-10-CM | POA: Diagnosis not present

## 2017-11-14 DIAGNOSIS — M797 Fibromyalgia: Secondary | ICD-10-CM | POA: Diagnosis not present

## 2017-11-14 DIAGNOSIS — M150 Primary generalized (osteo)arthritis: Secondary | ICD-10-CM | POA: Diagnosis not present

## 2017-11-14 DIAGNOSIS — M25532 Pain in left wrist: Secondary | ICD-10-CM | POA: Diagnosis not present

## 2017-11-14 DIAGNOSIS — Z79899 Other long term (current) drug therapy: Secondary | ICD-10-CM | POA: Diagnosis not present

## 2017-11-14 DIAGNOSIS — R768 Other specified abnormal immunological findings in serum: Secondary | ICD-10-CM | POA: Diagnosis not present

## 2017-11-14 DIAGNOSIS — M7551 Bursitis of right shoulder: Secondary | ICD-10-CM | POA: Diagnosis not present

## 2017-11-14 DIAGNOSIS — L4050 Arthropathic psoriasis, unspecified: Secondary | ICD-10-CM | POA: Diagnosis not present

## 2017-11-24 DIAGNOSIS — M5441 Lumbago with sciatica, right side: Secondary | ICD-10-CM | POA: Diagnosis not present

## 2017-11-24 DIAGNOSIS — L4050 Arthropathic psoriasis, unspecified: Secondary | ICD-10-CM | POA: Diagnosis not present

## 2017-11-24 DIAGNOSIS — G8929 Other chronic pain: Secondary | ICD-10-CM | POA: Diagnosis not present

## 2017-11-24 DIAGNOSIS — Z79899 Other long term (current) drug therapy: Secondary | ICD-10-CM | POA: Diagnosis not present

## 2017-11-27 DIAGNOSIS — I10 Essential (primary) hypertension: Secondary | ICD-10-CM | POA: Diagnosis not present

## 2017-11-27 DIAGNOSIS — I959 Hypotension, unspecified: Secondary | ICD-10-CM | POA: Diagnosis not present

## 2017-12-12 DIAGNOSIS — I10 Essential (primary) hypertension: Secondary | ICD-10-CM | POA: Diagnosis not present

## 2017-12-12 DIAGNOSIS — I959 Hypotension, unspecified: Secondary | ICD-10-CM | POA: Diagnosis not present

## 2017-12-21 DIAGNOSIS — Z23 Encounter for immunization: Secondary | ICD-10-CM | POA: Diagnosis not present

## 2017-12-21 DIAGNOSIS — I10 Essential (primary) hypertension: Secondary | ICD-10-CM | POA: Diagnosis not present

## 2017-12-22 DIAGNOSIS — M5441 Lumbago with sciatica, right side: Secondary | ICD-10-CM | POA: Diagnosis not present

## 2017-12-22 DIAGNOSIS — G8929 Other chronic pain: Secondary | ICD-10-CM | POA: Diagnosis not present

## 2017-12-22 DIAGNOSIS — Z79899 Other long term (current) drug therapy: Secondary | ICD-10-CM | POA: Diagnosis not present

## 2017-12-22 DIAGNOSIS — Z6832 Body mass index (BMI) 32.0-32.9, adult: Secondary | ICD-10-CM | POA: Diagnosis not present

## 2018-01-04 DIAGNOSIS — R0989 Other specified symptoms and signs involving the circulatory and respiratory systems: Secondary | ICD-10-CM | POA: Diagnosis not present

## 2018-01-22 DIAGNOSIS — L4050 Arthropathic psoriasis, unspecified: Secondary | ICD-10-CM | POA: Diagnosis not present

## 2018-01-22 DIAGNOSIS — G8929 Other chronic pain: Secondary | ICD-10-CM | POA: Diagnosis not present

## 2018-01-22 DIAGNOSIS — Z79899 Other long term (current) drug therapy: Secondary | ICD-10-CM | POA: Diagnosis not present

## 2018-01-22 DIAGNOSIS — M25522 Pain in left elbow: Secondary | ICD-10-CM | POA: Diagnosis not present

## 2018-01-22 DIAGNOSIS — M25512 Pain in left shoulder: Secondary | ICD-10-CM | POA: Diagnosis not present

## 2018-01-22 DIAGNOSIS — M5441 Lumbago with sciatica, right side: Secondary | ICD-10-CM | POA: Diagnosis not present

## 2018-02-08 ENCOUNTER — Encounter: Payer: Self-pay | Admitting: Cardiovascular Disease

## 2018-02-08 DIAGNOSIS — E559 Vitamin D deficiency, unspecified: Secondary | ICD-10-CM | POA: Diagnosis not present

## 2018-02-08 DIAGNOSIS — I10 Essential (primary) hypertension: Secondary | ICD-10-CM | POA: Diagnosis not present

## 2018-02-08 DIAGNOSIS — E7800 Pure hypercholesterolemia, unspecified: Secondary | ICD-10-CM | POA: Diagnosis not present

## 2018-02-08 DIAGNOSIS — E039 Hypothyroidism, unspecified: Secondary | ICD-10-CM | POA: Diagnosis not present

## 2018-02-08 DIAGNOSIS — M8580 Other specified disorders of bone density and structure, unspecified site: Secondary | ICD-10-CM | POA: Diagnosis not present

## 2018-02-12 DIAGNOSIS — E119 Type 2 diabetes mellitus without complications: Secondary | ICD-10-CM | POA: Diagnosis not present

## 2018-02-12 DIAGNOSIS — I10 Essential (primary) hypertension: Secondary | ICD-10-CM | POA: Diagnosis not present

## 2018-02-12 DIAGNOSIS — E039 Hypothyroidism, unspecified: Secondary | ICD-10-CM | POA: Diagnosis not present

## 2018-02-13 ENCOUNTER — Encounter: Payer: Self-pay | Admitting: Neurology

## 2018-02-13 DIAGNOSIS — Z7289 Other problems related to lifestyle: Secondary | ICD-10-CM | POA: Diagnosis not present

## 2018-02-13 DIAGNOSIS — I700 Atherosclerosis of aorta: Secondary | ICD-10-CM | POA: Diagnosis not present

## 2018-02-13 DIAGNOSIS — R1011 Right upper quadrant pain: Secondary | ICD-10-CM | POA: Diagnosis not present

## 2018-02-13 DIAGNOSIS — Z6832 Body mass index (BMI) 32.0-32.9, adult: Secondary | ICD-10-CM | POA: Diagnosis not present

## 2018-02-13 DIAGNOSIS — E1169 Type 2 diabetes mellitus with other specified complication: Secondary | ICD-10-CM | POA: Diagnosis not present

## 2018-02-13 DIAGNOSIS — L4050 Arthropathic psoriasis, unspecified: Secondary | ICD-10-CM | POA: Diagnosis not present

## 2018-02-13 DIAGNOSIS — R490 Dysphonia: Secondary | ICD-10-CM | POA: Diagnosis not present

## 2018-02-13 DIAGNOSIS — I10 Essential (primary) hypertension: Secondary | ICD-10-CM | POA: Diagnosis not present

## 2018-02-13 DIAGNOSIS — E7800 Pure hypercholesterolemia, unspecified: Secondary | ICD-10-CM | POA: Diagnosis not present

## 2018-02-13 DIAGNOSIS — E039 Hypothyroidism, unspecified: Secondary | ICD-10-CM | POA: Diagnosis not present

## 2018-02-13 DIAGNOSIS — Z0000 Encounter for general adult medical examination without abnormal findings: Secondary | ICD-10-CM | POA: Diagnosis not present

## 2018-02-13 DIAGNOSIS — F17200 Nicotine dependence, unspecified, uncomplicated: Secondary | ICD-10-CM | POA: Diagnosis not present

## 2018-02-13 DIAGNOSIS — K219 Gastro-esophageal reflux disease without esophagitis: Secondary | ICD-10-CM | POA: Diagnosis not present

## 2018-02-14 ENCOUNTER — Other Ambulatory Visit: Payer: Self-pay | Admitting: Internal Medicine

## 2018-02-14 DIAGNOSIS — R1011 Right upper quadrant pain: Secondary | ICD-10-CM

## 2018-02-14 DIAGNOSIS — L4050 Arthropathic psoriasis, unspecified: Secondary | ICD-10-CM | POA: Diagnosis not present

## 2018-02-14 DIAGNOSIS — Z79899 Other long term (current) drug therapy: Secondary | ICD-10-CM | POA: Diagnosis not present

## 2018-02-14 DIAGNOSIS — M150 Primary generalized (osteo)arthritis: Secondary | ICD-10-CM | POA: Diagnosis not present

## 2018-02-14 DIAGNOSIS — M797 Fibromyalgia: Secondary | ICD-10-CM | POA: Diagnosis not present

## 2018-02-14 DIAGNOSIS — M7551 Bursitis of right shoulder: Secondary | ICD-10-CM | POA: Diagnosis not present

## 2018-02-14 DIAGNOSIS — R768 Other specified abnormal immunological findings in serum: Secondary | ICD-10-CM | POA: Diagnosis not present

## 2018-02-19 DIAGNOSIS — M25521 Pain in right elbow: Secondary | ICD-10-CM | POA: Diagnosis not present

## 2018-02-19 DIAGNOSIS — Z79899 Other long term (current) drug therapy: Secondary | ICD-10-CM | POA: Diagnosis not present

## 2018-02-19 DIAGNOSIS — M5441 Lumbago with sciatica, right side: Secondary | ICD-10-CM | POA: Diagnosis not present

## 2018-02-19 DIAGNOSIS — G8929 Other chronic pain: Secondary | ICD-10-CM | POA: Diagnosis not present

## 2018-02-19 DIAGNOSIS — M25511 Pain in right shoulder: Secondary | ICD-10-CM | POA: Diagnosis not present

## 2018-02-21 DIAGNOSIS — M7551 Bursitis of right shoulder: Secondary | ICD-10-CM | POA: Diagnosis not present

## 2018-02-21 DIAGNOSIS — L4050 Arthropathic psoriasis, unspecified: Secondary | ICD-10-CM | POA: Diagnosis not present

## 2018-02-21 DIAGNOSIS — Z79899 Other long term (current) drug therapy: Secondary | ICD-10-CM | POA: Diagnosis not present

## 2018-02-21 DIAGNOSIS — M150 Primary generalized (osteo)arthritis: Secondary | ICD-10-CM | POA: Diagnosis not present

## 2018-02-21 DIAGNOSIS — R768 Other specified abnormal immunological findings in serum: Secondary | ICD-10-CM | POA: Diagnosis not present

## 2018-02-21 DIAGNOSIS — M797 Fibromyalgia: Secondary | ICD-10-CM | POA: Diagnosis not present

## 2018-02-22 ENCOUNTER — Ambulatory Visit
Admission: RE | Admit: 2018-02-22 | Discharge: 2018-02-22 | Disposition: A | Discharge status: Home / Self Care | Payer: Medicare Other | Source: Ambulatory Visit | Attending: Internal Medicine | Admitting: Internal Medicine

## 2018-02-22 DIAGNOSIS — R109 Unspecified abdominal pain: Secondary | ICD-10-CM | POA: Diagnosis not present

## 2018-02-22 DIAGNOSIS — R1011 Right upper quadrant pain: Secondary | ICD-10-CM

## 2018-02-23 DIAGNOSIS — Z1231 Encounter for screening mammogram for malignant neoplasm of breast: Secondary | ICD-10-CM | POA: Diagnosis not present

## 2018-02-27 DIAGNOSIS — J383 Other diseases of vocal cords: Secondary | ICD-10-CM | POA: Diagnosis not present

## 2018-02-27 DIAGNOSIS — K219 Gastro-esophageal reflux disease without esophagitis: Secondary | ICD-10-CM | POA: Diagnosis not present

## 2018-02-27 DIAGNOSIS — K449 Diaphragmatic hernia without obstruction or gangrene: Secondary | ICD-10-CM | POA: Diagnosis not present

## 2018-02-27 DIAGNOSIS — F17210 Nicotine dependence, cigarettes, uncomplicated: Secondary | ICD-10-CM | POA: Diagnosis not present

## 2018-02-27 DIAGNOSIS — G248 Other dystonia: Secondary | ICD-10-CM | POA: Diagnosis not present

## 2018-03-12 DIAGNOSIS — C642 Malignant neoplasm of left kidney, except renal pelvis: Secondary | ICD-10-CM | POA: Diagnosis not present

## 2018-03-12 DIAGNOSIS — M545 Low back pain: Secondary | ICD-10-CM | POA: Diagnosis not present

## 2018-03-12 DIAGNOSIS — N281 Cyst of kidney, acquired: Secondary | ICD-10-CM | POA: Diagnosis not present

## 2018-03-20 DIAGNOSIS — M25511 Pain in right shoulder: Secondary | ICD-10-CM | POA: Diagnosis not present

## 2018-03-20 DIAGNOSIS — Z79899 Other long term (current) drug therapy: Secondary | ICD-10-CM | POA: Diagnosis not present

## 2018-03-20 DIAGNOSIS — M25512 Pain in left shoulder: Secondary | ICD-10-CM | POA: Diagnosis not present

## 2018-03-20 DIAGNOSIS — M5441 Lumbago with sciatica, right side: Secondary | ICD-10-CM | POA: Diagnosis not present

## 2018-03-20 DIAGNOSIS — G8929 Other chronic pain: Secondary | ICD-10-CM | POA: Diagnosis not present

## 2018-04-09 ENCOUNTER — Ambulatory Visit (INDEPENDENT_AMBULATORY_CARE_PROVIDER_SITE_OTHER): Payer: Medicare Other | Admitting: Neurology

## 2018-04-09 ENCOUNTER — Encounter: Payer: Self-pay | Admitting: Neurology

## 2018-04-09 VITALS — BP 113/74 | HR 70 | Ht 62.0 in | Wt 174.0 lb

## 2018-04-09 DIAGNOSIS — R51 Headache: Secondary | ICD-10-CM

## 2018-04-09 DIAGNOSIS — Z789 Other specified health status: Secondary | ICD-10-CM

## 2018-04-09 DIAGNOSIS — E669 Obesity, unspecified: Secondary | ICD-10-CM

## 2018-04-09 DIAGNOSIS — E66811 Obesity, class 1: Secondary | ICD-10-CM

## 2018-04-09 DIAGNOSIS — R519 Headache, unspecified: Secondary | ICD-10-CM

## 2018-04-09 NOTE — Progress Notes (Signed)
Subjective:    Patient ID: [name] is a 62 y.o. female.  HPI     Interim history:   Dear Dr. Shelia Media,  I saw your patient, [name], upon your kind request in my neurologic clinic today for evaluation of her recurrent headaches, concern for migraines. The patient is unaccompanied today. As you know, [name] is a 62 year old right-handed woman with an underlying medical history of psoriasis, psoriatic arthritis, carotid artery stenosis, reflux disease, chronic low back pain, followed by pain management in [place], smoking, osteopenia, hypertension, hyperlipidemia, fibromyalgia, and obesity, who reports recurrent headaches that can last weeks. She reports a headache frequency of one or 2 per month but they can last a long time. She does have associated nausea and photophobia. I reviewed your office note from 02/13/2018, which you kindly included. I have seen her previously on 2 occasions, last seen over 2 and half years ago for recurrent headaches which were not migrainous at the time and she reported feeling better, she was advised to follow-up as needed. Previous workup included a brain MRI with and without contrast on 07/08/2015: IMPRESSION:  Mildly abnormal MRI brain (with and without) demonstrating: 1. Scattered periventricular and subcortical punctate T2 hyperintensities. No abnormal lesions are seen on post contrast views. Likely represents mild chronic small vessel ischemic disease.  2. No acute findings. 3. No change from MRI on 05/31/14.   She had an EEG on 06/30/2015: Impression: This is a normal EEG recording in the waking state. No evidence of ictal or interictal discharges are seen. This is at times technically difficult secondary to electrode artifact.   Of note, she is on multiple potentially sedating medications including oxycodone up to 3 times a day, she is on methadone 4 times a day, trazodone at bedtime, high-dose Zanaflex 4 mg up to 4 times a day. In addition,  she is on high-dose gabapentin 600 mg 4 times a day. She has an Epworth sleepiness score of 3 out of 24, fatigue score of 34 out of 63. She reports having had a sleep study several years ago. She has never been on CPAP therapy. She does report intermittent difficulty maintaining sleep, attributes this to chronic pain. She takes trazodone as needed. She has been on gabapentin for several years. She cannot take 600 mg altogether and typically takes it in 300 mg increments. She has chronic neck pain. She has had some blurry vision, she had her routine eye examination in March. She had blood work in the past and was told that she may have a pituitary tumor. She did not have any interim brain MRI since the one from March 2017. She sees an endocrinologist.  Previously:   10/01/2015: 62 year old right-handed woman with an underlying complex medical history of psoriasis, psoriatic arthritis, Sjogren's syndrome, carotid stenosis, reflux disease, low back pain wth history of lumbar spinal stenosis reported, on chronic narcotic pain medications, smoking, osteopenia, glaucoma, hyperlipidemia, hypertension, fibromyalgia, laryngitis, hypothyroidism, on numerous medications including tizanidine, vitamin D, Zetia, methadone, oxycodone, losartan, Zyrtec, gabapentin, Flonase, Enbrel, Celebrex, prednisone (prn), Synthroid, and Lipitor, hx of left bundle-branch block, history of syncope and near syncope, status post loop recorder  placement, and obesity, who presents for follow-up consultation of her recurrent headaches. The patient is unaccompanied today. I first met her on 06/29/2015 at the request of her primary care physician, at which time she reported recurrent headaches for the past 2-3 months, some blurry vision, some nausea, no vomiting. She reported a remote history of  migraines in her teenage years. I suggested a brain MRI with and without contrast and an EEG. I did not suggest any additional medications as she was on  multiple medications at the time. She had a brain MRI with and without contrast on 07/08/2015: IMPRESSION:   Mildly abnormal MRI brain (with and without) demonstrating: 1. Scattered periventricular and subcortical punctate T2 hyperintensities. No abnormal lesions are seen on post contrast views. Likely represents mild chronic small vessel ischemic disease.   2. No acute findings. 3. No change from MRI on 05/31/14.  We called her with her test results. She had an EEG on 06/30/2015: Impression: This is a normal EEG recording in the waking state. No evidence of ictal or interictal discharges are seen. This is at times technically difficult secondary to electrode artifact. We called her with her test results.   Today, 10/01/2015: She reports doing a little better, had blood work with Dr. Shelia Media 07/29/15, which I reviewed. Her PCP referred her to endocrinologist, and she saw Dr. Chalmers Cater, had more blood work. Was told, there may be a mild dysfunction of the pituitary glad, but is supposed to be observed, from what I understand. Also, it sounds like she had a glucose tolerance test and failed it and was told she has diabetes.     Previously:   06/29/2015: She reports recurrent headaches for the past 2-3 months, associated with lethargy, blurry vision, facial swelling on both sides. She has bifrontal HAs and it may radiate to the top and back, b/l, associated with nausea, no vomiting.   She had migraine in her teenage years.   This HAs is worse as in more constant with more nausea.   She had a CT Head wo contrast on 02/28/11 for indication migraine:  IMPRESSION: No acute intracranial abnormalities.     She was no chronic prednisone some 15 years ago, now only when needed, last time used was months ago.  For her blurry vision she has seen ophthalmology a few months ago, and was told that her symptoms were not related to her glaucoma. I reviewed your office note from 06/15/18 17, which you kindly included.  Recent blood work through your office included a CMP on 05/21/2015, which was unremarkable. She had a sleep study some 6-7 years ago and states, she did not have OSA then. She has gained weight in the last 3 months, in the realm of 28 lb.    She had a positive ANA recently, with normal ESR. She sees a rheumatologist.    She smokes cigarettes, about 4-10 per day. She does not drink alcohol.   BP goes up and down. She drinks caffeine quite a bit: 4-6 cups of coffee and 2 cans of Cola.    She has nocturia up to 4 times a night, worse lately. She lives alone. She has 2 grown children. Her 48 year old granddaughter stays over often but has her own bedroom. She has noted snoring.   She is the youngest of 44 children, one brother had brain cancer, one had a brain aneurysm. One brother had mesothelioma.    No OSA, no Migraine in FHx.  Her Past Medical History Is Significant For: Past Medical History:  Diagnosis Date  . Addiction, opium ([place])   . Arthritis   . Chronic back pain   . Fibromyalgia   . GERD (gastroesophageal reflux disease)   . Glaucoma   . Hypercholesteremia   . Hyperlipemia   . Hypertension   . Hypokalemia   .  Hypothyroid   . Laryngitis   . LBBB (left bundle branch block)   . Osteopenia   . Osteopenia   . Psoriasis   . Sjogren's syndrome ([place])   . Stenosis of right subclavian artery ([place]) 12/22/2015    Her Past Surgical History Is Significant For: Past Surgical History:  Procedure Laterality Date  . APPENDECTOMY    . CARDIAC CATHETERIZATION  06/18/2002   normal  . CESAREAN SECTION    . LAPAROSCOPIC OVARIAN CYSTECTOMY    . LOOP RECORDER IMPLANT N/A 04/22/2014   Procedure: LOOP RECORDER IMPLANT;  Surgeon: Sanda Klein, MD;  Location: [facility]ATH [facility];  Service: Cardiovascular;  Laterality: N/A;  . LOOP RECORDER REMOVAL N/A 05/17/2017   Procedure: LOOP RECORDER REMOVAL;  Surgeon: Sanda Klein, MD;  Location: [facility];  Service: Cardiovascular;  Laterality: N/A;   . NM MYOCAR PERF WALL MOTION  07/17/2009   fixed anteroseptal defect,no ischemia  . TONSILLECTOMY    . US ECHOCARDIOGRAPHY  08/05/2010   normal    Her Family History Is Significant For: Family History  Problem Relation Age of Onset  . Hypertension Mother   . Heart failure Mother   . Diabetes Mother   . Diabetes Father   . Heart failure Father   . Hypertension Father   . Cancer Brother        brain  . Cancer Brother        lung  . Hypertension Brother   . Hypertension Sister     Her Social History Is Significant For: Social History   Socioeconomic History  . Marital status: Divorced    Spouse name: Not on file  . Number of children: 2  . Years of education: Not on file  . Highest education level: Not on file  Occupational History  . Occupation: Disabled   Social Needs  . Financial resource strain: Not on file  . Food insecurity:    Worry: Not on file    Inability: Not on file  . Transportation needs:    Medical: Not on file    Non-medical: Not on file  Tobacco Use  . Smoking status: Current Every Day Smoker    Packs/day: 0.80    Years: 45.00    Pack years: 36.00  . Smokeless tobacco: Never Used  Substance and Sexual Activity  . Alcohol use: No    Alcohol/week: 0.0 standard drinks  . Drug use: No  . Sexual activity: Not on file  Lifestyle  . Physical activity:    Days per week: Not on file    Minutes per session: Not on file  . Stress: Not on file  Relationships  . Social connections:    Talks on phone: Not on file    Gets together: Not on file    Attends religious service: Not on file    Active member of club or organization: Not on file    Attends meetings of clubs or organizations: Not on file    Relationship status: Not on file  Other Topics Concern  . Not on file  Social History Narrative   Drinks 4-6 cups of coffee a day and 2 sodas     Her Allergies Are:  Allergies  Allergen Reactions  . Adalimumab Nausea And Vomiting and Other (See  Comments)    Headache,   . Arava [Leflunomide] Other (See Comments)    Heart races   . Aspirin Other (See Comments)    Upset stomach, burning sensation.  . Codeine  Nausea And Vomiting and Other (See Comments)    Dizzy,heart speeds up  . Darvocet [Propoxyphene N-Acetaminophen] Other (See Comments)    Heart races  . Fioricet [Butalbital-Apap-Caffeine] Other (See Comments)    Heart races.  . Levofloxacin Nausea And Vomiting  . Sertraline Hcl Other (See Comments)    Patient states "like a movie going on and can see killing someone else"  . Sulfa Antibiotics Diarrhea and Other (See Comments)    Stomach upset  :   Her Current Medications Are:  Outpatient Encounter Medications as of 04/09/2018  Medication Sig  . albuterol (PROAIR HFA) 108 (90 BASE) MCG/ACT inhaler Inhale 2 puffs into the lungs every 6 (six) hours as needed for wheezing or shortness of breath.  Marland Kitchen atorvastatin (LIPITOR) 40 MG tablet Take 40 mg by mouth daily.  . celecoxib (CELEBREX) 200 MG capsule Take 200 mg by mouth 2 (two) times daily.   . Cholecalciferol (VITAMIN D) 2000 UNITS tablet Take 2,000 Units by mouth 2 (two) times daily.  Marland Kitchen etanercept (ENBREL) 50 MG/ML injection Inject 50 mg into the skin 2 (two) times a week. Tuesday and Friday.  . ezetimibe (ZETIA) 10 MG tablet Take 5 mg by mouth daily.  Marland Kitchen gabapentin (NEURONTIN) 600 MG tablet Take 600 mg by mouth 4 (four) times daily.  Marland Kitchen levothyroxine (SYNTHROID, LEVOTHROID) 100 MCG tablet Take 100 mcg by mouth daily before breakfast.  . methadone (DOLOPHINE) 10 MG tablet Take 10 mg by mouth 4 (four) times daily.   Marland Kitchen olmesartan-hydrochlorothiazide (BENICAR HCT) 40-25 MG tablet Take 1 tablet by mouth daily.  . Omega-3 Fatty Acids (FISH OIL) 1000 MG CAPS Take 1,000 mg by mouth 2 (two) times daily.  Marland Kitchen oxyCODONE-acetaminophen (ENDOCET) 10-325 MG per tablet Take 1 tablet by mouth 3 (three) times daily as needed for pain.  Marland Kitchen tiZANidine (ZANAFLEX) 4 MG tablet Take 4 mg by mouth 4  (four) times daily.   . traZODone (DESYREL) 50 MG tablet Take 50 mg by mouth at bedtime.  . [DISCONTINUED] Cinnamon 500 MG capsule Take 500 mg by mouth 2 (two) times daily.  . [DISCONTINUED] diclofenac sodium (VOLTAREN) 1 % GEL Apply 1 application topically 3 (three) times daily as needed (pain). On hands and knees  . [DISCONTINUED] levothyroxine (SYNTHROID, LEVOTHROID) 112 MCG tablet Take 112 mcg by mouth daily before breakfast.   . [DISCONTINUED] lidocaine (LIDODERM) 5 % Place 1 patch onto the skin daily as needed (pain). Remove & Discard patch within 12 hours or as directed by MD   . [DISCONTINUED] loratadine (CLARITIN) 10 MG tablet Take 10 mg by mouth 2 (two) times daily.  . [DISCONTINUED] losartan-hydrochlorothiazide (HYZAAR) 100-25 MG per tablet Take 1 tablet by mouth daily.  . [DISCONTINUED] Potassium 99 MG TABS Take 1 tablet by mouth 2 (two) times daily.   No facility-administered encounter medications on file as of 04/09/2018.   :  Review of Systems:  Out of a complete 14 point review of systems, all are reviewed and negative with the exception of these symptoms as listed below:  Review of Systems  Neurological:       Pt presents today to discuss her headaches. Pt gets 1-2 headaches per month but the headache can last up to a month. Pt gets associated nausea and vomiting with photophobia and phonophobia. Pt has blurry vision but has gotten an updated glasses prescription and uses eye drops.  Pt does not endorse nightly snoring. She had a sleep study 6-7 years ago and does not use a  cpap.  Epworth Sleepiness Scale 0= would never doze 1= slight chance of dozing 2= moderate chance of dozing 3= high chance of dozing  Sitting and reading: 1 Watching TV: 1 Sitting inactive in a public place (ex. Theater or meeting): 0 As a passenger in a car for an hour without a break: 0 Lying down to rest in the afternoon: 1 Sitting and talking to someone: 0 Sitting quietly after lunch (no  alcohol): 0 In a car, while stopped in traffic: 0 Total: 3     Objective:  Neurological Exam  Physical Exam Physical Examination:   Vitals:   04/09/18 1024  BP: 113/74  Pulse: 70   General Examination: The patient is a very pleasant 62 y.o. female in no acute distress. She appears well-developed and well-nourished and well groomed.   HEENT: Normocephalic, atraumatic, pupils are equal, round and reactive to light and accommodation. Extraocular tracking is good without limitation to gaze excursion or nystagmus noted. Normal smooth pursuit is noted. Corrective eyeglasses in place. Hearing is grossly intact. Face is symmetric with normal facial animation and normal facial sensation. Speech is clear with no dysarthria noted. There is no hypophonia. There is no lip, neck/head, jaw or voice tremor. Neck is supple with full range of passive and active motion. There are no carotid bruits on auscultation. Oropharynx exam reveals: moderate mouth dryness, adequate dental hygiene. Tongue protrudes centrally and palate elevates symmetrically. Tonsils are absent.   Chest: Clear to auscultation without wheezing, rhonchi or crackles noted.  Heart: S1+S2+0, regular and normal without murmurs, rubs or gallops noted.   Abdomen: Soft, non-tender and non-distended with normal bowel sounds appreciated on auscultation.  Extremities: There is no pitting edema in the distal lower extremities bilaterally.   Skin: Warm and dry without trophic changes noted. There are no varicose veins.  Musculoskeletal: exam reveals prominent joint deformities in hands in keeping with arthritic changes, hands are larger in appearance as well.   Neurologically:  Mental status: The patient is awake, alert and oriented in all 4 spheres. Her immediate and remote memory, attention, language skills and fund of knowledge are appropriate. There is no evidence of aphasia, agnosia, apraxia or anomia. Speech is clear with normal  prosody and enunciation. Thought process is linear. Mood is normal and affect is normal.  Cranial nerves II - XII are as described above under HEENT exam. In addition: shoulder shrug is normal with equal shoulder height noted. Motor exam: Normal bulk, strength and tone is noted. There is no drift, tremor or rebound. Romberg is negative. Reflexes are 1+ throughout, except trace in the ankles. Fine motor skills and coordination: mildly impaired in the hands, likely due to arthritic changes and joint deformities.  Cerebellar testing: No dysmetria or intention tremor. There is no truncal or gait ataxia.  Sensory exam: intact to light touch.  Gait, station and balance: She stands easily. No veering to one side is noted. No leaning to one side is noted. Posture is age-appropriate and stance is narrow based. Gait shows normal stride length and normal pace. No problems turning are noted.  Assessment and Plan:  In summary, TRINISHA PAGET is a very pleasant 62 year old female with an underlying complicated medical history of psoriasis, psoriatic arthritis, Sjogren's syndrome, carotid stenosis, reflux disease, low back pain wth history of lumbar spinal stenosis reported, on chronic narcotic pain medications, smoking, osteopenia, glaucoma, hyperlipidemia, hypertension, fibromyalgia, laryngitis, hypothyroidism, on numerous potentially sedating medications, who reports recurrent headaches lasting for weeks at a  time. Previous workup included a brain MRI and EEG. Nevertheless, I would like to proceed with a repeat brain MRI as she was told she may have a pituitary adenoma. We will look into this with a brain MRI with and without contrast, furthermore I would like to do a sleep study to rule out sleep apnea as a potential cause for recurrent sustained headaches. Unfortunately, for chronic headache management she is already on multiple sedating medications, some of which were utilized for headache management including a  muscle relaxer and gabapentin which is currently fairly high dose in her case. She may benefit from seeing a headache specialist as a more comprehensive headache management Center. We will proceed with additional testing in the form of repeat brain MRI and sleep study and keep her posted as to the results. If she has obstructive sleep apnea she is encouraged to try CPAP her AutoPap therapy. She would be willing to try it. I answered all her questions today and she was in agreement.  Thank you very much for allowing me to participate in the care of this nice patient. If I can be of any further assistance to you please do not hesitate to call me at [phone].  Sincerely,   Star Age, MD, PhD

## 2018-04-09 NOTE — Patient Instructions (Addendum)
We will investigate your headaches with a repeat brain MRI and a sleep study. If you have sleep apnea, I would like for you to try a CPAP or autoPAP.  You may benefit from seeing a headache specialist at a headache management center. Please reduce your caffeine intake, as this may help reduce your headaches. Work towards having 1-2 servings per day only.

## 2018-04-10 LAB — BASIC METABOLIC PANEL
BUN/Creatinine Ratio: 18 (ref 12–28)
BUN: 16 mg/dL (ref 8–27)
CHLORIDE: 96 mmol/L (ref 96–106)
CO2: 26 mmol/L (ref 20–29)
Calcium: 9.8 mg/dL (ref 8.7–10.3)
Creatinine, Ser: 0.88 mg/dL (ref 0.57–1.00)
GFR, EST AFRICAN AMERICAN: 83 mL/min/{1.73_m2} (ref >59)
GFR, EST NON AFRICAN AMERICAN: 72 mL/min/{1.73_m2} (ref >59)
Glucose: 92 mg/dL (ref 65–99)
Potassium: 4 mmol/L (ref 3.5–5.2)
SODIUM: 136 mmol/L (ref 134–144)

## 2018-04-30 ENCOUNTER — Ambulatory Visit (INDEPENDENT_AMBULATORY_CARE_PROVIDER_SITE_OTHER): Payer: Medicare Other | Admitting: Neurology

## 2018-04-30 DIAGNOSIS — G4734 Idiopathic sleep related nonobstructive alveolar hypoventilation: Secondary | ICD-10-CM

## 2018-04-30 DIAGNOSIS — G4720 Circadian rhythm sleep disorder, unspecified type: Secondary | ICD-10-CM

## 2018-04-30 DIAGNOSIS — G4489 Other headache syndrome: Secondary | ICD-10-CM

## 2018-04-30 DIAGNOSIS — R519 Headache, unspecified: Secondary | ICD-10-CM

## 2018-04-30 DIAGNOSIS — R0683 Snoring: Secondary | ICD-10-CM

## 2018-04-30 DIAGNOSIS — E669 Obesity, unspecified: Secondary | ICD-10-CM

## 2018-04-30 DIAGNOSIS — R51 Headache: Principal | ICD-10-CM

## 2018-04-30 DIAGNOSIS — Z789 Other specified health status: Secondary | ICD-10-CM

## 2018-05-03 ENCOUNTER — Telehealth: Payer: Self-pay | Admitting: Neurology

## 2018-05-03 NOTE — Procedures (Signed)
PATIENT'S NAME:  [name], Anfinson DOB:      06/14/1955      MR#:    [id number]     DATE OF RECORDING: 04/30/2018 REFERRING M.D.:  Deland Pretty, MD Study Performed:   Baseline Polysomnogram HISTORY: 63 year old woman with a history of psoriasis, psoriatic arthritis, carotid artery stenosis, reflux disease, chronic low back pain, on narcotic pain meds, followed by pain management, smoking, osteopenia, hypertension, hyperlipidemia, fibromyalgia, and obesity, who reports recurrent headaches, difficulty maintaining sleep. The patient endorsed the Epworth Sleepiness Scale at 3/24 points. The patient's weight 174 pounds with a height of 62 (inches), resulting in a BMI of 32. kg/m2. The patient's neck circumference measured 17 inches.  CURRENT MEDICATIONS: Proair, Lipitor, Celebrex, Vitamin D, Enbrel, Zeita, Neurontin, Synthroid, Dolophine, Benicar, Omega-3, Endocet, Zanaflex, Desyrel.   PROCEDURE:  This is a multichannel digital polysomnogram utilizing the Somnostar 11.2 system.  Electrodes and sensors were applied and monitored per AASM Specifications.   EEG, EOG, Chin and Limb EMG, were sampled at 200 Hz.  ECG, Snore and Nasal Pressure, Thermal Airflow, Respiratory Effort, CPAP Flow and Pressure, Oximetry was sampled at 50 Hz. Digital video and audio were recorded.      BASELINE STUDY  Lights Out was at 22:02 and Lights On at 05:15.  Total recording time (TRT) was 433 minutes, with a total sleep time (TST) of 377.5 minutes.   The patient's sleep latency was 28 minutes.  REM latency was 184 minutes, which is delayed. The sleep efficiency was 87.2 %.     SLEEP ARCHITECTURE: WASO (Wake after sleep onset) was 27 minutes with minimal to mild sleep fragmentation noted. There were 12 minutes in Stage N1, 177.5 minutes Stage N2, 139.5 minutes Stage N3 and 48.5 minutes in Stage REM.  The percentage of Stage N1 was 3.2%, Stage N2 was 47.%, Stage N3 was 37.% and Stage R (REM sleep) was 12.8%, which is reduced. The  arousals were noted as: 27 were spontaneous, 0 were associated with PLMs, 3 were associated with respiratory events.  RESPIRATORY ANALYSIS:  There were a total of 17 respiratory events:  12 obstructive apneas, 1 central apneas and 0 mixed apneas with a total of 13 apneas and an apnea index (AI) of 2.1 /hour. There were 4 hypopneas with a hypopnea index of .6 /hour. The patient also had 0 respiratory event related arousals (RERAs).      The total APNEA/HYPOPNEA INDEX (AHI) was 2.7 /hour and the total RESPIRATORY DISTURBANCE INDEX was 2.7 /hour.  10 events occurred in REM sleep and 2 events in NREM. The REM AHI was 12.4 /hour, versus a non-REM AHI of 1.3. The patient spent 377.5 minutes of total sleep time in the supine position and 0 minutes in non-supine.. The supine AHI was 2.7 versus a non-supine AHI of 0.0.  OXYGEN SATURATION & C02:  The Wake baseline 02 saturation was 94%, with the lowest being 84%. Time spent below 89% saturation equaled 82 minutes. PERIODIC LIMB MOVEMENTS: The patient had a total of 0 Periodic Limb Movements.  The Periodic Limb Movement (PLM) index was 0 and the PLM Arousal index was 0/hour.  Audio and video analysis did not show any abnormal or unusual movements, behaviors, phonations or vocalizations. The patient took 1 bathroom breaks. Mild intermittent snoring was noted. The EKG was in keeping with normal sinus rhythm (NSR).  Post-study, the patient indicated that sleep was the same as usual.   IMPRESSION:  1. Oxygen desaturations during Sleep 2. Primary Snoring 3.  Dysfunctions associated with sleep stages or arousals from sleep   RECOMMENDATIONS:  1. This study does not demonstrate any significant obstructive or central sleep disordered breathing with the exception of mild intermittent snoring. The patient was noted to have lower trending oxygen saturations during sleep, in the absence of obstructive respiratory events. Weight loss, smoking cessation should be pursued  as well as avoidance of potentially sedating medications as much a possible and feasible. She may benefit from consultation with a lung doctor to rule out an underlying lung disease.  2. This study shows sleep fragmentation and abnormal sleep stage percentages; these are nonspecific findings and per se do not signify an intrinsic sleep disorder or a cause for the patient's sleep-related symptoms. Causes include (but are not limited to) the first night effect of the sleep study, circadian rhythm disturbances, medication effect or an underlying mood disorder or medical problem.  3. The patient should be cautioned not to drive, work at heights, or operate dangerous or heavy equipment when tired or sleepy. Review and reiteration of good sleep hygiene measures should be pursued with any patient. 4. The patient will be advised to follow up with the referring provider, who will be notified of the test results.  I certify that I have reviewed the entire raw data recording prior to the issuance of this report in accordance with the Standards of Accreditation of the American Academy of Sleep Medicine (AASM)   Star Age, MD, PhD Diplomat, American Board of Neurology and Sleep Medicine (Neurology and Sleep Medicine)

## 2018-05-03 NOTE — Addendum Note (Signed)
Addended by: Star Age on: 05/03/2018 08:36 AM   Modules accepted: Orders

## 2018-05-03 NOTE — Telephone Encounter (Signed)
-----   Message from Star Age, MD sent at 05/03/2018  8:36 AM EST ----- Patient referred by Dr. Shelia Media for re-eval of HAs, seen by me on 04/09/18, diagnostic PSG on 04/30/18.   Please call and notify the patient that the recent sleep study did not show any significant obstructive sleep apnea with the exception of mild intermittent snoring. The patient was noted to have lower trending oxygen saturations during sleep, in the absence of obstructive respiratory events. Weight loss, smoking cessation should be pursued as well as avoidance of potentially sedating medications as much a possible and feasible. She may benefit from consultation with a lung doctor to rule out an underlying lung disease. I would encourage her to d/w Dr. Shelia Media smoking cessation and potential referral to a pulmonologist.  As far as her HAs, I had d/w her repeating her brain MRI, but I don't see the order. Did I put it for an outside MRI location? I will go ahead and put order in now.   Thanks,  Star Age, MD, PhD [facility])

## 2018-05-03 NOTE — Progress Notes (Signed)
Patient referred by Dr. Shelia Media for re-eval of HAs, seen by me on 04/09/18, diagnostic PSG on 04/30/18.   Please call and notify the patient that the recent sleep study did not show any significant obstructive sleep apnea with the exception of mild intermittent snoring. The patient was noted to have lower trending oxygen saturations during sleep, in the absence of obstructive respiratory events. Weight loss, smoking cessation should be pursued as well as avoidance of potentially sedating medications as much a possible and feasible. She may benefit from consultation with a lung doctor to rule out an underlying lung disease. I would encourage her to d/w Dr. Shelia Media smoking cessation and potential referral to a pulmonologist.  As far as her HAs, I had d/w her repeating her brain MRI, but I don't see the order. Did I put it for an outside MRI location? I will go ahead and put order in now.   Thanks,  Star Age, MD, PhD [facility])

## 2018-05-03 NOTE — Telephone Encounter (Signed)
Pt has returned call to [place], she is asking for a call back

## 2018-05-03 NOTE — Telephone Encounter (Signed)
Called the patient to make her aware that Dr Rexene Alberts reviewed her sleep study. No answer. LVM instructing the patient to call back.

## 2018-05-03 NOTE — Telephone Encounter (Signed)
Called the patient and made her aware of the sleep study results. Advised the patient of the findings noted by Dr Rexene Alberts. Encouraged the patient to follow up with PCP to address the need for potentially needing to see a pulmonologist in relation to her hypoxemia that was noted in the sleep study. Pt verbalized understanding. Patient requested a copy be sent to Dr Shelia Media as well as her in the mail. Made sure the address was correct on file and advised the patient that we would do both. Pt verbalized understanding. Pt had no questions at this time but was encouraged to call back if questions arise.

## 2018-05-04 ENCOUNTER — Encounter: Payer: Self-pay | Admitting: Cardiovascular Disease

## 2018-05-04 ENCOUNTER — Ambulatory Visit (INDEPENDENT_AMBULATORY_CARE_PROVIDER_SITE_OTHER): Payer: Medicare Other | Admitting: Cardiovascular Disease

## 2018-05-04 VITALS — BP 134/62 | HR 69 | Ht 62.0 in | Wt 177.8 lb

## 2018-05-04 DIAGNOSIS — Z716 Tobacco abuse counseling: Secondary | ICD-10-CM

## 2018-05-04 DIAGNOSIS — I447 Left bundle-branch block, unspecified: Secondary | ICD-10-CM

## 2018-05-04 DIAGNOSIS — R55 Syncope and collapse: Secondary | ICD-10-CM

## 2018-05-04 DIAGNOSIS — I771 Stricture of artery: Secondary | ICD-10-CM

## 2018-05-04 DIAGNOSIS — I10 Essential (primary) hypertension: Secondary | ICD-10-CM

## 2018-05-04 DIAGNOSIS — E7800 Pure hypercholesterolemia, unspecified: Secondary | ICD-10-CM | POA: Diagnosis not present

## 2018-05-04 NOTE — Patient Instructions (Signed)

## 2018-05-04 NOTE — Progress Notes (Signed)
Cardiology Office Note    Date:  05/05/2018   ID:  Latona, Krichbaum [birth year], MRN [id number]  PCP:  Deland Pretty, MD  Cardiologist:   Sanda Klein, MD   Chief complaint:  Palpitations, near syncope   History of Present Illness:  [name] is a 63 y.o. female chronic left bundle branch block and neurally mediated syncope.   She had a implantable loop recorder in place since January 2016-2019 (now explanted), which did not detect any significant arrhythmia.  She has not had full-blown syncope since the loop recorder was implanted but has occasional spells of feeling weak and has documented blood pressure in the 70s.    Dizzy spells occur roughly oce a week but she is able to avoid them by sitting down promptly.  She has problems with psoriatic and rheumatoid arthritis improved on chronic infusions of Enbrel.  Labs are checked repeatedly in the rheumatology office.  The patient specifically denies any chest pain at rest exertion, dyspnea at rest or with exertion, orthopnea, paroxysmal nocturnal dyspnea, syncope, palpitations, focal neurological deficits, intermittent claudication, lower extremity edema, unexplained weight gain, cough, hemoptysis or wheezing.  She does not have any symptoms of upper extremity claudication, despite a known gradient between the blood pressures (higher in the left arm compared to the right arm).    She is confused about her report she received regarding the sleep study that was performed via [facility] neurology.  She was told that her oxygen saturations dropped to 84% during sleep but that she did not have obstructive sleep apnea and did not need CPAP.  She does describe daytime hypersomnolence and is known to snore.  She is committed to permanent smoking cessation and has decided that she will stop when she runs out of her current last 2 packs of cigarettes.  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 She also has a long-standing left bundle branch block which has been documented at least since 2007. She had a normal echocardiogram in 2012. Normal nuclear stress test in 2011. Minimal plaque in the carotids by ultrasonography in 2011 and December 2014. She has moderate right subclavian artery stenosis. The ultrasound performed in 2014 showed a stable 50-69 % in the right subclavian artery. The blood pressure gradient is 10-20 mmHg. The peak velocity in the subclavian artery was 325 cm/s (contralateral 243 cm/s).  Loop recorder was implanted in January 2016 after an episode of syncope, but it has never shows significant arrhythmia and she has not had recurrent syncope.  Presumed vasovagal mechanism.  Past Medical History:  Diagnosis Date  . Addiction, opium ([place])   . Arthritis   . Chronic back pain   . Fibromyalgia   . GERD (gastroesophageal reflux disease)   . Glaucoma   . Hypercholesteremia   . Hyperlipemia   . Hypertension   . Hypokalemia   . Hypothyroid   . Laryngitis   . LBBB (left bundle branch block)   . Osteopenia   . Osteopenia   . Psoriasis   . Sjogren's syndrome ([place])   . Stenosis of right subclavian artery ([place]) 12/22/2015    Past Surgical History:  Procedure Laterality Date  . APPENDECTOMY    . CARDIAC CATHETERIZATION  06/18/2002   normal  . CESAREAN SECTION    . LAPAROSCOPIC OVARIAN CYSTECTOMY    . LOOP RECORDER IMPLANT N/A 04/22/2014   Procedure: LOOP RECORDER IMPLANT;  Surgeon: Sanda Klein, MD;  Location[facility] CATH  LAB;  Service: Cardiovascular;  Laterality: N/A;  . LOOP RECORDER REMOVAL N/A 05/17/2017   Procedure: LOOP RECORDER REMOVAL;  Surgeon: Sanda Klein, MD;  Location: [facility] CV LAB;  Service: Cardiovascular;  Laterality: N/A;  . NM MYOCAR PERF WALL MOTION  07/17/2009   fixed anteroseptal defect,no ischemia  . TONSILLECTOMY    . US ECHOCARDIOGRAPHY  08/05/2010   normal    Current Medications: Outpatient Medications Prior to Visit    Medication Sig Dispense Refill  . albuterol (PROAIR HFA) 108 (90 BASE) MCG/ACT inhaler Inhale 2 puffs into the lungs every 6 (six) hours as needed for wheezing or shortness of breath.    Marland Kitchen atorvastatin (LIPITOR) 40 MG tablet Take 40 mg by mouth daily.    . celecoxib (CELEBREX) 200 MG capsule Take 200 mg by mouth 2 (two) times daily.     . Cholecalciferol (VITAMIN D) 2000 UNITS tablet Take 2,000 Units by mouth 2 (two) times daily.    Marland Kitchen etanercept (ENBREL) 50 MG/ML injection Inject 50 mg into the skin 2 (two) times a week. Tuesday and Friday.    . ezetimibe (ZETIA) 10 MG tablet Take 5 mg by mouth daily.    Marland Kitchen gabapentin (NEURONTIN) 600 MG tablet Take 600 mg by mouth 4 (four) times daily.  5  . levothyroxine (SYNTHROID, LEVOTHROID) 100 MCG tablet Take 100 mcg by mouth daily before breakfast.    . methadone (DOLOPHINE) 10 MG tablet Take 10 mg by mouth 4 (four) times daily.     Marland Kitchen olmesartan-hydrochlorothiazide (BENICAR HCT) 40-25 MG tablet Take 1 tablet by mouth daily.    . Omega-3 Fatty Acids (FISH OIL) 1000 MG CAPS Take 1,000 mg by mouth 2 (two) times daily.    Marland Kitchen oxyCODONE-acetaminophen (ENDOCET) 10-325 MG per tablet Take 1 tablet by mouth 3 (three) times daily as needed for pain.    Marland Kitchen tiZANidine (ZANAFLEX) 4 MG tablet Take 4 mg by mouth 4 (four) times daily.     . traZODone (DESYREL) 50 MG tablet Take 50 mg by mouth at bedtime.     No facility-administered medications prior to visit.      Allergies:   Adalimumab; Arava [leflunomide]; Aspirin; Codeine; Darvocet [propoxyphene n-acetaminophen]; Fioricet [butalbital-apap-caffeine]; Levofloxacin; Sertraline hcl; and Sulfa antibiotics   Social History   Socioeconomic History  . Marital status: Divorced    Spouse name: Not on file  . Number of children: 2  . Years of education: Not on file  . Highest education level: Not on file  Occupational History  . Occupation: Disabled   Social Needs  . Financial resource strain: Not on file  . Food  insecurity:    Worry: Not on file    Inability: Not on file  . Transportation needs:    Medical: Not on file    Non-medical: Not on file  Tobacco Use  . Smoking status: Current Every Day Smoker    Packs/day: 0.80    Years: 45.00    Pack years: 36.00  . Smokeless tobacco: Never Used  Substance and Sexual Activity  . Alcohol use: No    Alcohol/week: 0.0 standard drinks  . Drug use: No  . Sexual activity: Not on file  Lifestyle  . Physical activity:    Days per week: Not on file    Minutes per session: Not on file  . Stress: Not on file  Relationships  . Social connections:    Talks on phone: Not on file    Gets together: Not on file  Attends religious service: Not on file    Active member of club or organization: Not on file    Attends meetings of clubs or organizations: Not on file    Relationship status: Not on file  Other Topics Concern  . Not on file  Social History Narrative   Drinks 4-6 cups of coffee a day and 2 sodas      Family History:  The patient's family history includes Cancer in her brother and brother; Diabetes in her father and mother; Heart failure in her father and mother; Hypertension in her brother, father, mother, and sister.   ROS:   Please see the history of present illness.    ROS All other systems reviewed and are negative.   PHYSICAL EXAM:   VS:  BP 134/62   Pulse 69   Ht 5\' 2"  (1.575 m)   Wt 177 lb 12.8 oz (80.6 kg)   BMI 32.52 kg/m     As always, the blood pressure in the right upper extremity is roughly 10 mmHg lower compared to the left.  General: Alert, oriented x3, no distress, obese Head: no evidence of trauma, PERRL, EOMI, no exophtalmos or lid lag, no myxedema, no xanthelasma; normal ears, nose and oropharynx Neck: normal jugular venous pulsations and no hepatojugular reflux; brisk carotid pulses without delay and no carotid bruits Chest: clear to auscultation, no signs of consolidation by percussion or palpation, normal  fremitus, symmetrical and full respiratory excursions Cardiovascular: normal position and quality of the apical impulse, regular rhythm, normal first and paradoxically split second heart sounds, no murmurs, rubs or gallops Abdomen: no tenderness or distention, no masses by palpation, no abnormal pulsatility or arterial bruits, normal bowel sounds, no hepatosplenomegaly Extremities: no clubbing, cyanosis or edema; 2+ radial, ulnar and brachial pulses bilaterally; 2+ right femoral, posterior tibial and dorsalis pedis pulses; 2+ left femoral, posterior tibial and dorsalis pedis pulses; no subclavian or femoral bruits Neurological: grossly nonfocal Psych: Normal mood and affect   Wt Readings from Last 3 Encounters:  05/04/18 177 lb 12.8 oz (80.6 kg)  04/09/18 174 lb (78.9 kg)  05/17/17 180 lb (81.6 kg)      Studies/Labs Reviewed:   EKG:  EKG is ordered today.  It shows sinus rhythm, left bundle branch block.  QRS 156 ms, QTC 475 ms  Recent Labs: 04/09/2018: BUN 16; Creatinine, Ser 0.88; Potassium 4.0; Sodium 136   Lipid Panel    Component Value Date/Time   CHOL  08/14/2008 0210    139        ATP III CLASSIFICATION:  <200     mg/dL   Desirable  200-239  mg/dL   Borderline High  >=240    mg/dL   High          TRIG 125 08/14/2008 0210   HDL 28 (L) 08/14/2008 0210   CHOLHDL 5.0 08/14/2008 0210   VLDL 25 08/14/2008 0210   LDLCALC  08/14/2008 0210    86        Total Cholesterol/HDL:CHD Risk Coronary Heart Disease Risk Table                     Men   Women  1/2 Average Risk   3.4   3.3  Average Risk       5.0   4.4  2 X Average Risk   9.6   7.1  3 X Average Risk  23.4   11.0  Use the calculated Patient Ratio above and the CHD Risk Table to determine the patient's CHD Risk.        ATP III CLASSIFICATION (LDL):  <100     mg/dL   Optimal  100-129  mg/dL   Near or Above                    Optimal  130-159  mg/dL   Borderline  160-189  mg/dL   High  >190     mg/dL   Very  High    Labs from February 08, 2018 Total cholesterol 115, triglycerides 95, HDL 47, LDL 49 Normal liver function tests, potassium 4.2, creatinine 0.8, hemoglobin 13.6  ASSESSMENT:    1. Vasovagal syncope   2. LBBB (left bundle branch block)   3. Stenosis of right subclavian artery ([place])   4. Hypercholesteremia   5. Essential hypertension   6. Encounter for smoking cessation counseling      PLAN:  In order of problems listed above:  1. Syncope: No full-blown episodes in about 4 years.  Reviewed the importance of aggressive rehydration, relatively high sodium diet, avoidance of triggers, promptly heeding prodromal symptoms by lying down rather than just sitting. 2. LBBB: No evidence of high-grade AV block by ECG or loop recorder.  No indication for pacemaker at this time.  Her older brother does have a pacemaker and its possible that she has advanced conduction system disease for her age and will eventually need a device as well. 3. Right subclavian stenosis: Stable 10-20 mmHg gradient between the right and left arm blood pressures.  Always check blood pressure and left arm. 4. HLP: On combination of atorvastatin and ezetimibe, followed by Dr. Shelia Media.  Excellent LDL cholesterol last October. 5. HTN: Well-controlled.  I would avoid higher doses of diuretics since these may worsen her tendency to have syncope. 6. Tobacco abuse: Congratulated her on the decision to completely quit smoking.    Medication Adjustments/Labs and Tests Ordered: Current medicines are reviewed at length with the patient today.  Concerns regarding medicines are outlined above.  Medication changes, Labs and Tests ordered today are listed in the Patient Instructions below. Patient Instructions  Medication Instructions:  Your physician recommends that you continue on your current medications as directed. Please refer to the Current Medication list given to you today.  If you need a refill on your cardiac  medications before your next appointment, please call your pharmacy.   Lab work: None ordered If you have labs (blood work) drawn today and your tests are completely normal, you will receive your results only by: Marland Kitchen MyChart Message (if you have MyChart) OR . A paper copy in the mail If you have any lab test that is abnormal or we need to change your treatment, we will call you to review the results.  Testing/Procedures: None ordered  Follow-Up: At [facility], you and your health needs are our priority.  As part of our continuing mission to provide you with exceptional heart care, we have created designated Provider Care Teams.  These Care Teams include your primary Cardiologist (physician) and Advanced Practice Providers (APPs -  Physician Assistants and Nurse Practitioners) who all work together to provide you with the care you need, when you need it. You will need a follow up appointment in 12 months.  Please call our office 2 months in advance to schedule this appointment.  You may see Sanda Klein, MD or one of the following Advanced Practice  Providers on your designated Care Team: Almyra Deforest, [place] . Fabian Sharp, PA-C       Signed, Sanda Klein, MD  05/05/2018 12:48 PM    [address], [place], [place]  [address] Phone: [phone]; Fax: [phone]

## 2018-05-09 ENCOUNTER — Encounter (INDEPENDENT_AMBULATORY_CARE_PROVIDER_SITE_OTHER): Payer: Self-pay

## 2018-05-09 ENCOUNTER — Ambulatory Visit (INDEPENDENT_AMBULATORY_CARE_PROVIDER_SITE_OTHER)
Admission: RE | Admit: 2018-05-09 | Discharge: 2018-05-09 | Disposition: A | Discharge status: Home / Self Care | Payer: Medicare Other | Source: Ambulatory Visit | Attending: Acute Care | Admitting: Acute Care

## 2018-05-09 DIAGNOSIS — Z122 Encounter for screening for malignant neoplasm of respiratory organs: Secondary | ICD-10-CM

## 2018-05-09 DIAGNOSIS — F17210 Nicotine dependence, cigarettes, uncomplicated: Secondary | ICD-10-CM

## 2018-05-10 ENCOUNTER — Telehealth: Payer: Self-pay | Admitting: Cardiovascular Disease

## 2018-05-10 NOTE — Telephone Encounter (Signed)
New Message:    Patient saw the doctor last week. Patient states that her oxygen low between 80 and 84. Patient states she is not feeling well. Also patient states no one called her concerning her result. Please call patient.

## 2018-05-10 NOTE — Telephone Encounter (Signed)
Spoke with pt, aware of dr croitoru comments and Almyra Free is aware to discuss with dr Claiborne Billings.

## 2018-05-10 NOTE — Telephone Encounter (Signed)
Spoke with pt, when she saw dr c he had told her he was going to have her sleep study reviewed by our sleep doctor because her oxygen was low on the test but she was told she did not have sleep apnea. She was to call if she had heard back. Will forward to dr croitoru to review and advise.

## 2018-05-10 NOTE — Telephone Encounter (Signed)
I sent a message to Dr. Claiborne Billings via [place]. He was on vacation until Tuesday, so he probably has not had a chance to review it yet. If he is in the office, could you please remind him? Thanks [employer]

## 2018-05-14 ENCOUNTER — Telehealth: Payer: Self-pay | Admitting: Acute Care

## 2018-05-14 DIAGNOSIS — Z122 Encounter for screening for malignant neoplasm of respiratory organs: Secondary | ICD-10-CM

## 2018-05-14 DIAGNOSIS — F17210 Nicotine dependence, cigarettes, uncomplicated: Secondary | ICD-10-CM

## 2018-05-15 NOTE — Telephone Encounter (Signed)
Pt informed of CT results per Sarah Groce, NP.  PT verbalized understanding.  Copy sent to PCP.  Order placed for 1 yr f/u CT.  

## 2018-06-13 ENCOUNTER — Encounter: Payer: Self-pay | Admitting: Pulmonary Disease

## 2018-06-13 ENCOUNTER — Ambulatory Visit (INDEPENDENT_AMBULATORY_CARE_PROVIDER_SITE_OTHER): Payer: Medicare Other | Admitting: Pulmonary Disease

## 2018-06-13 VITALS — BP 118/78 | HR 69 | Ht 62.0 in | Wt 183.0 lb

## 2018-06-13 DIAGNOSIS — R7981 Abnormal blood-gas level: Secondary | ICD-10-CM | POA: Diagnosis not present

## 2018-06-13 DIAGNOSIS — I272 Pulmonary hypertension, unspecified: Secondary | ICD-10-CM

## 2018-06-13 NOTE — Patient Instructions (Signed)
Overnight oximetry on room air-to assess low oxygen levels  Echocardiogram-to assess for any contributing heart disease  Encourage you to quit smoking as we discussed  I will see you back in the office in about 3 months  Call with significant concerns

## 2018-06-13 NOTE — Progress Notes (Signed)
[name]    [id number]    03-13-1956  Primary Care Physician:Pharr, Thayer Jew, MD  Referring Physician: Deland Pretty, [place],  [address]  Chief complaint:   Patient has been seen for nocturnal desaturations on recent sleep study  HPI:  Denies any significant daytime symptoms An active smoker, started smoking at age 63 Has quit several times in the past Currently only smokes about 2-5 sticks a day  Recent CT did show emphysema  She will not repeat a pulmonary function study-last one was many years ago  Uses rescue inhaler as needed  Recent evaluation did reveal a growth on her kidney  Outpatient Encounter Medications as of 06/13/2018  Medication Sig  . albuterol (PROAIR HFA) 108 (90 BASE) MCG/ACT inhaler Inhale 2 puffs into the lungs every 6 (six) hours as needed for wheezing or shortness of breath.  Marland Kitchen atorvastatin (LIPITOR) 40 MG tablet Take 40 mg by mouth daily.  . celecoxib (CELEBREX) 200 MG capsule Take 200 mg by mouth 2 (two) times daily.   . Cholecalciferol (VITAMIN D) 2000 UNITS tablet Take 2,000 Units by mouth 2 (two) times daily.  Marland Kitchen etanercept (ENBREL) 50 MG/ML injection Inject 50 mg into the skin 2 (two) times a week. Tuesday and Friday.  . ezetimibe (ZETIA) 10 MG tablet Take 5 mg by mouth daily.  Marland Kitchen gabapentin (NEURONTIN) 600 MG tablet Take 600 mg by mouth 4 (four) times daily.  Marland Kitchen levothyroxine (SYNTHROID, LEVOTHROID) 100 MCG tablet Take 100 mcg by mouth daily before breakfast.  . methadone (DOLOPHINE) 10 MG tablet Take 10 mg by mouth 4 (four) times daily.   Marland Kitchen olmesartan-hydrochlorothiazide (BENICAR HCT) 40-25 MG tablet Take 1 tablet by mouth daily.  . Omega-3 Fatty Acids (FISH OIL) 1000 MG CAPS Take 1,000 mg by mouth 2 (two) times daily.  Marland Kitchen oxyCODONE-acetaminophen (ENDOCET) 10-325 MG per tablet Take 1 tablet by mouth 3 (three) times daily as needed for pain.  Marland Kitchen tiZANidine (ZANAFLEX) 4 MG tablet Take 4 mg by mouth 4  (four) times daily.   . traZODone (DESYREL) 50 MG tablet Take 50 mg by mouth at bedtime.   No facility-administered encounter medications on file as of 06/13/2018.     Allergies as of 06/13/2018 - Review Complete 06/13/2018  Allergen Reaction Noted  . Adalimumab Nausea And Vomiting and Other (See Comments) 04/21/2014  . Arava [leflunomide] Other (See Comments) 02/28/2011  . Aspirin Other (See Comments) 02/28/2011  . Codeine Nausea And Vomiting and Other (See Comments) 02/28/2011  . Darvocet [propoxyphene n-acetaminophen] Other (See Comments) 02/28/2011  . Fioricet [butalbital-apap-caffeine] Other (See Comments) 02/28/2011  . Levofloxacin Nausea And Vomiting 02/28/2011  . Sertraline hcl Other (See Comments) 02/28/2011  . Sulfa antibiotics Diarrhea and Other (See Comments) 02/28/2011    Past Medical History:  Diagnosis Date  . Addiction, opium ([place])   . Arthritis   . Chronic back pain   . Fibromyalgia   . GERD (gastroesophageal reflux disease)   . Glaucoma   . Hypercholesteremia   . Hyperlipemia   . Hypertension   . Hypokalemia   . Hypothyroid   . Laryngitis   . LBBB (left bundle branch block)   . Osteopenia   . Osteopenia   . Psoriasis   . Sjogren's syndrome ([place])   . Stenosis of right subclavian artery ([place]) 12/22/2015    Past Surgical History:  Procedure Laterality Date  . APPENDECTOMY    . CARDIAC CATHETERIZATION  06/18/2002   normal  . CESAREAN SECTION    . LAPAROSCOPIC OVARIAN CYSTECTOMY    . LOOP RECORDER IMPLANT N/A 04/22/2014   Procedure: LOOP RECORDER IMPLANT;  Surgeon: Sanda Klein, MD;  Location: [facility];  Service: Cardiovascular;  Laterality: N/A;  . LOOP RECORDER REMOVAL N/A 05/17/2017   Procedure: LOOP RECORDER REMOVAL;  Surgeon: Sanda Klein, MD;  Location: [facility];  Service: Cardiovascular;  Laterality: N/A;  . NM MYOCAR PERF WALL MOTION  07/17/2009   fixed anteroseptal defect,no ischemia  . TONSILLECTOMY    . US ECHOCARDIOGRAPHY   08/05/2010   normal    Family History  Problem Relation Age of Onset  . Hypertension Mother   . Heart failure Mother   . Diabetes Mother   . Diabetes Father   . Heart failure Father   . Hypertension Father   . Cancer Brother        brain  . Cancer Brother        lung  . Hypertension Brother   . Hypertension Sister     Social History   Socioeconomic History  . Marital status: Divorced    Spouse name: Not on file  . Number of children: 2  . Years of education: Not on file  . Highest education level: Not on file  Occupational History  . Occupation: Disabled   Social Needs  . Financial resource strain: Not on file  . Food insecurity:    Worry: Not on file    Inability: Not on file  . Transportation needs:    Medical: Not on file    Non-medical: Not on file  Tobacco Use  . Smoking status: Current Every Day Smoker    Packs/day: 0.80    Years: 45.00    Pack years: 36.00  . Smokeless tobacco: Never Used  Substance and Sexual Activity  . Alcohol use: No    Alcohol/week: 0.0 standard drinks  . Drug use: No  . Sexual activity: Not on file  Lifestyle  . Physical activity:    Days per week: Not on file    Minutes per session: Not on file  . Stress: Not on file  Relationships  . Social connections:    Talks on phone: Not on file    Gets together: Not on file    Attends religious service: Not on file    Active member of club or organization: Not on file    Attends meetings of clubs or organizations: Not on file    Relationship status: Not on file  . Intimate partner violence:    Fear of current or ex partner: Not on file    Emotionally abused: Not on file    Physically abused: Not on file    Forced sexual activity: Not on file  Other Topics Concern  . Not on file  Social History Narrative   Drinks 4-6 cups of coffee a day and 2 sodas     Review of Systems  HENT: Negative.   Eyes: Negative.   Respiratory: Negative.   Cardiovascular: Positive for leg  swelling.  Gastrointestinal: Negative.   Endocrine: Negative.   All other systems reviewed and are negative.  Vitals:   06/13/18 1137  BP: 118/78  Pulse: 69  SpO2: 98%   Physical Exam  Constitutional: She appears well-developed and well-nourished.  HENT:  Head: Normocephalic and atraumatic.  Eyes: Pupils are equal, round, and reactive to light. Conjunctivae and EOM are normal. Right eye exhibits no discharge.  Left eye exhibits no discharge.  Neck: Normal range of motion. Neck supple. No tracheal deviation present. No thyromegaly present.  Cardiovascular: Normal rate, regular rhythm and normal heart sounds.  Pulmonary/Chest: Effort normal. No respiratory distress. She has no wheezes. She has no rales.  Abdominal: Soft. Bowel sounds are normal. She exhibits no distension. There is no abdominal tenderness.   Data Reviewed: Polysomnogram reviewed  Assessment:   Negative sleep study for obstructive sleep apnea, did have significant oxygen desaturations  History of obstructive lung disease-emphysema on CT scan of the chest  Active smoker -She states she will definitely be quitting  Plan/Recommendations:  Reason for a nocturnal desaturations may be obstructive lung disease  Need to rule out possibility of pulmonary hypertension-extent of emphysema is mild, she does have leg swellings on occasions  Continue bronchodilator treatments  Strongly encouraged to quit smoking  Obtain nocturnal oximetry on room air-possibility of requiring supplemental oxygen discussed with the patient  I will see her back in the office in about 3 months  Sherrilyn Rist MD [facility] 06/13/2018, 12:04 PM  CC: Deland Pretty, MD

## 2018-06-15 ENCOUNTER — Ambulatory Visit (HOSPITAL_COMMUNITY): Payer: Medicare Other | Attending: Internal Medicine

## 2018-06-15 DIAGNOSIS — I272 Pulmonary hypertension, unspecified: Secondary | ICD-10-CM | POA: Insufficient documentation

## 2018-07-19 ENCOUNTER — Encounter: Payer: Self-pay | Admitting: Pulmonary Disease

## 2018-07-26 ENCOUNTER — Telehealth: Payer: Self-pay | Admitting: Pulmonary Disease

## 2018-07-26 DIAGNOSIS — G4734 Idiopathic sleep related nonobstructive alveolar hypoventilation: Secondary | ICD-10-CM

## 2018-07-26 NOTE — Telephone Encounter (Signed)
Received ONO on room air results from [facility]. Results were read by TP since AO is covering in the hospital due to COVID-19.  Per TP, ONO was positive for nocturnal desats. Patient is not currently using O2. Patient had a sleep study but it was negative for OSA. TP recommends beginning 2L of O2 at night. Follow up in 3 months to reassess.   Spoke with patient. She is aware of results. I answered all of her questions to the best of my ability. She is ok with proceeding with the O2 at night. Advised her that I would place the order today with [facility]. She verbalized understanding.   Recall has been placed for her to follow up with AO in July once the schedules are available.

## 2018-08-06 ENCOUNTER — Telehealth: Payer: Self-pay | Admitting: Pulmonary Disease

## 2018-08-06 ENCOUNTER — Ambulatory Visit: Payer: Medicare Other | Admitting: Podiatry

## 2018-08-06 NOTE — Telephone Encounter (Signed)
[name], have you seen this CMN yet? Please advise thanks

## 2018-08-06 NOTE — Telephone Encounter (Signed)
It was received on Friday 08/03/18 and I gave it to [place] this morning for TP to sign

## 2018-08-07 NOTE — Telephone Encounter (Signed)
Jessica hasn't given me CMNs that Tammy Parrett has signed yet

## 2018-08-07 NOTE — Telephone Encounter (Signed)
[name] from [place] is calling back she want's to know about the CMN  [phone]  or [phone]

## 2018-08-08 NOTE — Telephone Encounter (Signed)
This form was signed by Rexene Edison on 08/06/2018 and it was on my desk this morning. I have faxed the CMN to [employer] and received confirmation that it was received

## 2018-09-10 ENCOUNTER — Ambulatory Visit (INDEPENDENT_AMBULATORY_CARE_PROVIDER_SITE_OTHER): Payer: Medicare Other | Admitting: Adult Health

## 2018-09-10 ENCOUNTER — Other Ambulatory Visit: Payer: Self-pay

## 2018-09-10 ENCOUNTER — Encounter: Payer: Self-pay | Admitting: Adult Health

## 2018-09-10 VITALS — BP 128/72 | HR 68 | Temp 97.5°F | Ht 63.0 in | Wt 185.2 lb

## 2018-09-10 DIAGNOSIS — G4734 Idiopathic sleep related nonobstructive alveolar hypoventilation: Secondary | ICD-10-CM | POA: Insufficient documentation

## 2018-09-10 DIAGNOSIS — Z720 Tobacco use: Secondary | ICD-10-CM | POA: Diagnosis not present

## 2018-09-10 DIAGNOSIS — J439 Emphysema, unspecified: Secondary | ICD-10-CM | POA: Insufficient documentation

## 2018-09-10 DIAGNOSIS — I272 Pulmonary hypertension, unspecified: Secondary | ICD-10-CM

## 2018-09-10 NOTE — Patient Instructions (Addendum)
Please set up for pulmonary function testing (COVID-19 preprocedure testing) in 6-8 weeks Continue on oxygen 2 L at bedtime ProAir HFA 2 puffs every 4hrs as needed.  Continue to work on quitting smoking .  Follow up with Dr. Hermina Staggers in 6-8  weeks and As needed   Please contact office for sooner follow up if symptoms do not improve or worsen or seek emergency care

## 2018-09-10 NOTE — Assessment & Plan Note (Signed)
Unclear etiology , neg for OSA , no pulmonary HTN noted on echo ,  Need PFT w/ dlco to evaluate lung function . (no mention of ILD on CT- she has autoimmune /CTD )   Encouraged on smoking cessation  Suspect  Chronic pain meds -methadone, zanaflex , trazodone , oxycodone may be contributing to hypoventilation at bedtime.   Plan  Patient Instructions  Please set up for pulmonary function testing (COVID-19 preprocedure testing) in 6-8 weeks Continue on oxygen 2 L at bedtime ProAir HFA 2 puffs every 4hrs as needed.  Continue to work on quitting smoking .  Follow up with Dr. Hermina Staggers in 6-8  weeks and As needed   Please contact office for sooner follow up if symptoms do not improve or worsen or seek emergency care

## 2018-09-10 NOTE — Assessment & Plan Note (Signed)
Smoking cessation discussed 

## 2018-09-10 NOTE — Assessment & Plan Note (Signed)
Emphysema noted on CT chest  Smoking cessation  Check PFT on return

## 2018-09-10 NOTE — Progress Notes (Signed)
@Patient  ID: [name], female    DOB: 1955/09/17, 63 y.o.   MRN: [id number]  Chief Complaint  Patient presents with  . Follow-up    nocturnal hypoxia     Referring provider: Deland Pretty, MD  HPI: 63 year old female active smoker followed for nocturnal hypoxemia on nocturnal oxygen.  Emphysema on CT chest Chronic pain w/ RA and Psoriatric  Arthritis , on chronic pain meds.   TEST/EVENTS :  Negative sleep study for OSA , + O2 desats   04/2018 CT chest -+emphysema   2D echo June 15, 2018 EF 55 to 60%, right ventricle normal systolic function right atrial pressure 10 mmHg  09/10/2018 Follow up : Nocturnal Hypoxemia  Patient returns for a 2 month follow up . She was seen for evaluation of nocturnal desaturations on sleep study . Sleep study was negative for sleep apnea , positive for hypoxia. She was set up for an Overnight oximetry test April 2020 showed positive desaturations. She was started on o2 at 2l/m At bedtime  . Says it make her feel better, less fatigued during daytime.  She was set up for 2  D Echo that was neg for pulmonary hypertension, preserved EF .  Previous CT chest showed emphysema . Denies cough . Gets winded with some activities . No chest pain or orthopnea.   Discussed smoking cessation, says she is cutting back .     Allergies  Allergen Reactions  . Adalimumab Nausea And Vomiting and Other (See Comments)    Headache,   . Arava [Leflunomide] Other (See Comments)    Heart races   . Aspirin Other (See Comments)    Upset stomach, burning sensation.  . Codeine Nausea And Vomiting and Other (See Comments)    Dizzy,heart speeds up  . Darvocet [Propoxyphene N-Acetaminophen] Other (See Comments)    Heart races  . Fioricet [Butalbital-Apap-Caffeine] Other (See Comments)    Heart races.  . Levofloxacin Nausea And Vomiting  . Sertraline Hcl Other (See Comments)    Patient states "like a movie going on and can see killing someone else"  . Sulfa Antibiotics  Diarrhea and Other (See Comments)    Stomach upset    Immunization History  Administered Date(s) Administered  . Influenza-Unspecified 01/08/2018  . Zoster Recombinat (Shingrix) 02/13/2018, 04/18/2018    Past Medical History:  Diagnosis Date  . Addiction, opium ([place])   . Arthritis   . Chronic back pain   . Fibromyalgia   . GERD (gastroesophageal reflux disease)   . Glaucoma   . Hypercholesteremia   . Hyperlipemia   . Hypertension   . Hypokalemia   . Hypothyroid   . Laryngitis   . LBBB (left bundle branch block)   . Osteopenia   . Osteopenia   . Psoriasis   . Sjogren's syndrome ([place])   . Stenosis of right subclavian artery ([place]) 12/22/2015    Tobacco History: Social History   Tobacco Use  Smoking Status Current Some Day Smoker  . Packs/day: 0.80  . Years: 45.00  . Pack years: 36.00  Smokeless Tobacco Never Used   Ready to quit: Not Answered Counseling given: Not Answered   Outpatient Medications Prior to Visit  Medication Sig Dispense Refill  . albuterol (PROAIR HFA) 108 (90 BASE) MCG/ACT inhaler Inhale 2 puffs into the lungs every 6 (six) hours as needed for wheezing or shortness of breath.    Marland Kitchen atorvastatin (LIPITOR) 40 MG tablet Take 40 mg by mouth daily.    . celecoxib (  CELEBREX) 200 MG capsule Take 200 mg by mouth 2 (two) times daily.     . Cholecalciferol (VITAMIN D) 2000 UNITS tablet Take 2,000 Units by mouth 2 (two) times daily.    Marland Kitchen etanercept (ENBREL) 50 MG/ML injection Inject 50 mg into the skin 2 (two) times a week. Tuesday and Friday.    . ezetimibe (ZETIA) 10 MG tablet Take 5 mg by mouth daily.    Marland Kitchen gabapentin (NEURONTIN) 600 MG tablet Take 600 mg by mouth 4 (four) times daily.  5  . levothyroxine (SYNTHROID, LEVOTHROID) 100 MCG tablet Take 100 mcg by mouth daily before breakfast.    . methadone (DOLOPHINE) 10 MG tablet Take 10 mg by mouth 4 (four) times daily.     Marland Kitchen olmesartan-hydrochlorothiazide (BENICAR HCT) 40-25 MG tablet Take 1 tablet by  mouth daily.    . Omega-3 Fatty Acids (FISH OIL) 1000 MG CAPS Take 1,000 mg by mouth 2 (two) times daily.    Marland Kitchen oxyCODONE-acetaminophen (ENDOCET) 10-325 MG per tablet Take 1 tablet by mouth 3 (three) times daily as needed for pain.    Marland Kitchen tiZANidine (ZANAFLEX) 4 MG tablet Take 4 mg by mouth 4 (four) times daily.     . traZODone (DESYREL) 50 MG tablet Take 50 mg by mouth at bedtime.     No facility-administered medications prior to visit.      Review of Systems:   Constitutional:   No  weight loss, night sweats,  Fevers, chills, fatigue, or  lassitude.  HEENT:   No headaches,  Difficulty swallowing,  Tooth/dental problems, or  Sore throat,                No sneezing, itching, ear ache, nasal congestion, post nasal drip,   CV:  No chest pain,  Orthopnea, PND, swelling in lower extremities, anasarca, dizziness, palpitations, syncope.   GI  No heartburn, indigestion, abdominal pain, nausea, vomiting, diarrhea, change in bowel habits, loss of appetite, bloody stools.   Resp: No shortness of breath with exertion or at rest.  No excess mucus, no productive cough,  No non-productive cough,  No coughing up of blood.  No change in color of mucus.  No wheezing.  No chest wall deformity  Skin: no rash or lesions.  GU: no dysuria, change in color of urine, no urgency or frequency.  No flank pain, no hematuria   MS:  + joint pain, chronic    Physical Exam  BP 128/72 (BP Location: Left Arm, Patient Position: Sitting, Cuff Size: Normal)   Pulse 68   Temp (!) 97.5 F (36.4 C) (Oral)   Ht 5\' 3"  (1.6 m)   Wt 185 lb 3.2 oz (84 kg)   SpO2 96%   BMI 32.81 kg/m   GEN: A/Ox3; pleasant , NAD, well nourished    HEENT:  Central/AT,  EACs-clear, TMs-wnl, NOSE-clear, THROAT-clear, no lesions, no postnasal drip or exudate noted.   NECK:  Supple w/ fair ROM; no JVD; normal carotid impulses w/o bruits; no thyromegaly or nodules palpated; no lymphadenopathy.    RESP  Clear  P & A; w/o, wheezes/ rales/ or  rhonchi. no accessory muscle use, no dullness to percussion  CARD:  RRR, no m/r/g, no peripheral edema, pulses intact, no cyanosis or clubbing.  GI:   Soft & nt; nml bowel sounds; no organomegaly or masses detected.   Musco: Warm bil, Arthritic changes of the hands bilateraly   Neuro: alert, no focal deficits noted.    Skin: Warm, no  lesions or rashes    Lab Results:  CBC    Component Value Date/Time   WBC 9.9 02/28/2011 1933   RBC 4.48 02/28/2011 1933   HGB 14.3 02/28/2011 1933   HCT 42.2 02/28/2011 1933   PLT 269 02/28/2011 1933   MCV 94.2 02/28/2011 1933   MCH 31.9 02/28/2011 1933   MCHC 33.9 02/28/2011 1933   RDW 12.4 02/28/2011 1933   LYMPHSABS 3.9 08/13/2008 1633   MONOABS 0.7 08/13/2008 1633   EOSABS 0.2 08/13/2008 1633   BASOSABS 0.1 08/13/2008 1633    BMET    Component Value Date/Time   NA 136 04/09/2018 1109   K 4.0 04/09/2018 1109   CL 96 04/09/2018 1109   CO2 26 04/09/2018 1109   GLUCOSE 92 04/09/2018 1109   GLUCOSE 102 (H) 02/28/2011 1933   BUN 16 04/09/2018 1109   CREATININE 0.88 04/09/2018 1109   CALCIUM 9.8 04/09/2018 1109   GFRNONAA 72 04/09/2018 1109   GFRAA 83 04/09/2018 1109    BNP No results found for: BNP  ProBNP No results found for: PROBNP  Imaging: No results found.    No flowsheet data found.  No results found for: NITRICOXIDE      Assessment & Plan:   Nocturnal hypoxemia Unclear etiology , neg for OSA , no pulmonary HTN noted on echo ,  Need PFT w/ dlco to evaluate lung function . (no mention of ILD on CT- she has autoimmune /CTD )   Encouraged on smoking cessation  Suspect  Chronic pain meds -methadone, zanaflex , trazodone , oxycodone may be contributing to hypoventilation at bedtime.   Plan  Patient Instructions  Please set up for pulmonary function testing (COVID-19 preprocedure testing) in 6-8 weeks Continue on oxygen 2 L at bedtime ProAir HFA 2 puffs every 4hrs as needed.  Continue to work on quitting  smoking .  Follow up with Dr. Hermina Staggers in 6-8  weeks and As needed   Please contact office for sooner follow up if symptoms do not improve or worsen or seek emergency care       Tobacco abuse Smoking cessation discussed   Emphysema, unspecified ([place]) Emphysema noted on CT chest  Smoking cessation  Check PFT on return      Aryssa Rosamond, NP 09/10/2018

## 2018-09-13 ENCOUNTER — Ambulatory Visit: Payer: Medicare Other | Admitting: Pulmonary Disease

## 2019-03-12 ENCOUNTER — Ambulatory Visit (INDEPENDENT_AMBULATORY_CARE_PROVIDER_SITE_OTHER): Payer: Medicare Other | Admitting: Allergy

## 2019-03-12 ENCOUNTER — Other Ambulatory Visit: Payer: Self-pay | Admitting: Allergy

## 2019-03-12 ENCOUNTER — Encounter: Payer: Self-pay | Admitting: Allergy

## 2019-03-12 ENCOUNTER — Other Ambulatory Visit: Payer: Self-pay

## 2019-03-12 VITALS — BP 142/78 | HR 68 | Temp 98.5°F | Resp 14 | Ht 62.5 in | Wt 178.2 lb

## 2019-03-12 DIAGNOSIS — T7840XD Allergy, unspecified, subsequent encounter: Secondary | ICD-10-CM | POA: Diagnosis not present

## 2019-03-12 DIAGNOSIS — L2389 Allergic contact dermatitis due to other agents: Secondary | ICD-10-CM | POA: Diagnosis not present

## 2019-03-12 NOTE — Progress Notes (Signed)
I have discussed this case with Aggie at the [facility] dental lab here in [place].  I discussed with her that I would like a small piece of the acrylic denture in order to patch test and any other ingredients included in the denture.  If she can also provide [place] with an ingredient list of what the a partial denture is made from that would also be helpful.  She states she will do some research on this and will get back to [place] when she has this information.  She also stated that they will likely need to make the acrylic piece for [place] as it is a polymer made from multiple ingredients and that they would be able to deliver the sample pieces once it has been made.  If she calls back please see if she can provide [place] with an ingredient list for the denture and a piece to patch with.

## 2019-03-12 NOTE — Progress Notes (Signed)
New Patient Note  RE: [name] MRN: [id number] DOB: 1955-07-02 Date of Office Visit: 03/12/2019  Referring provider: Dr. Maryelizabeth Kaufmann Primary care provider: Deland Pretty, MD  Chief Complaint: reaction to dentures  History of present illness: [name] is a 63 y.o. female presenting today for consultation for reaction to acrylic dentures.  She was referred today by her dentist. She has history of pulmonary hypertension managed by [facility] pulmonary as well as significant history is as outlined in the past medical history.  She states the first time she went to dentist after getting the dentures they didn't fit correctly.  This was on December 12, 2018.  She reports she went back on several occasions to the dentist reporting that the fit of the denture was not right and they were uncomfortable however she states that the fit has never been appropriate since she has received the denture.  But she states she has not worn them much at all since she has received them due to the ill fit.  Then she states on a Friday in late October early November she did put the denture in and may be headed in for about 6 hours and took it out because again it did not feel right and states the next morning when she woke up she noticed that her tongue was swollen and that she was very hoarse.  She also noted that she had a bump that she could feel on her gum.  She states she was concerned she might have trouble swallowing but denies having any difficulty breathing however she states that she was scared and did not use her albuterol inhaler.  She states that over the weekend the symptoms was not improving and she did go to her PCP on the following Monday and received a steroid injection as well as oral steroid to continue taking at home.  She states her symptoms did improve with this and has resolved. She has since not worn the denture and was advised to come to [place] for evaluation of allergy to potentially  acrylic in the denture. She states she has never had this type of a denture before with the acrylic base.  She has had dental procedures done as she does have several silver fillings.  She states she has had allergy testing in the past and has been negative.  The dentist did provide with information on the laboratory that fabricates the partial dentures, [facility] in [place], [place] (office manager [name]).     Review of systems (in the past 4 weeks): Review of Systems  Constitutional: Negative for chills, fever and malaise/fatigue.  HENT: Negative for congestion, ear discharge, nosebleeds, sinus pain and sore throat.   Eyes: Negative for pain, discharge and redness.  Respiratory: Negative.   Cardiovascular: Negative.   Gastrointestinal: Negative.   Musculoskeletal: Positive for joint pain.  Skin: Negative for itching and rash.  Neurological: Negative.     All other systems negative unless noted above in HPI  Past medical history: Past Medical History:  Diagnosis Date  . Addiction, opium ([place])   . Arthritis   . Cancer of kidney ([place])   . Chronic back pain   . COPD (chronic obstructive pulmonary disease) ([place])   . Fibromyalgia   . GERD (gastroesophageal reflux disease)   . Glaucoma   . Hypercholesteremia   . Hyperlipemia   . Hypertension   . Hypokalemia   . Hypothyroid   . Laryngitis   .  LBBB (left bundle branch block)   . Osteopenia   . Osteopenia   . Psoriasis   . Psoriatic arthritis ([place])   . Rheumatoid arthritis ([place])   . Sjogren's syndrome ([place])   . Stenosis of right subclavian artery ([place]) 12/22/2015    Past surgical history: Past Surgical History:  Procedure Laterality Date  . ABDOMINAL HYSTERECTOMY    . APPENDECTOMY    . CARDIAC CATHETERIZATION  06/18/2002   normal  . CESAREAN SECTION    . LAPAROSCOPIC OVARIAN CYSTECTOMY    . LOOP RECORDER IMPLANT N/A 04/22/2014   Procedure: LOOP RECORDER IMPLANT;  Surgeon: Sanda Klein, MD;  Location: [facility]  CATH LAB;  Service: Cardiovascular;  Laterality: N/A;  . LOOP RECORDER REMOVAL N/A 05/17/2017   Procedure: LOOP RECORDER REMOVAL;  Surgeon: Sanda Klein, MD;  Location: [facility] CV LAB;  Service: Cardiovascular;  Laterality: N/A;  . NM MYOCAR PERF WALL MOTION  07/17/2009   fixed anteroseptal defect,no ischemia  . TONSILLECTOMY    . US ECHOCARDIOGRAPHY  08/05/2010   normal    Family history:  Family History  Problem Relation Age of Onset  . Hypertension Mother   . Heart failure Mother   . Diabetes Mother   . Diabetes Father   . Heart failure Father   . Hypertension Father   . Asthma Father   . Cancer Brother        brain  . Cancer Brother        lung  . Hypertension Brother   . Hypertension Sister     Social history: Lives in a mobile home with out carpeting with electric heating and central cooling.  No pets in the home; dog outside the home.  There is questionable concern for water damage or mildew in the home.  No concern for roaches in the home. Occupational History  . Occupation: Disabled   Tobacco Use  . Smoking status: Current Some Day Smoker    Packs/day: 0.80    Years: 45.00    Pack years: 36.00  . Smokeless tobacco: Never Used    Medication List: Current Outpatient Medications  Medication Sig Dispense Refill  . albuterol (PROAIR HFA) 108 (90 BASE) MCG/ACT inhaler Inhale 2 puffs into the lungs every 6 (six) hours as needed for wheezing or shortness of breath.    Marland Kitchen atorvastatin (LIPITOR) 40 MG tablet Take 40 mg by mouth daily.    . celecoxib (CELEBREX) 200 MG capsule Take 200 mg by mouth 2 (two) times daily.     . Cholecalciferol (VITAMIN D) 2000 UNITS tablet Take 2,000 Units by mouth 2 (two) times daily.    Marland Kitchen etanercept (ENBREL) 50 MG/ML injection Inject 50 mg into the skin 2 (two) times a week. Tuesday and Friday.    . ezetimibe (ZETIA) 10 MG tablet Take 5 mg by mouth daily.    Marland Kitchen gabapentin (NEURONTIN) 600 MG tablet Take 600 mg by mouth 4 (four) times daily.   5  . levothyroxine (SYNTHROID, LEVOTHROID) 100 MCG tablet Take 100 mcg by mouth daily before breakfast.    . methadone (DOLOPHINE) 10 MG tablet Take 10 mg by mouth 4 (four) times daily.     Marland Kitchen olmesartan-hydrochlorothiazide (BENICAR HCT) 40-25 MG tablet Take 1 tablet by mouth daily.    . Omega-3 Fatty Acids (FISH OIL) 1000 MG CAPS Take 1,000 mg by mouth 2 (two) times daily.    Marland Kitchen oxyCODONE-acetaminophen (ENDOCET) 10-325 MG per tablet Take 1 tablet by mouth 3 (three) times daily as  needed for pain.    Marland Kitchen RESTASIS 0.05 % ophthalmic emulsion SMARTSIG:1 Drop(s) In Eye(s) Every 12 Hours    . tiZANidine (ZANAFLEX) 4 MG tablet Take 4 mg by mouth 4 (four) times daily.     . traZODone (DESYREL) 50 MG tablet Take 50 mg by mouth at bedtime.     No current facility-administered medications for this visit.     Known medication allergies: Allergies  Allergen Reactions  . Adalimumab Nausea And Vomiting and Other (See Comments)    Headache,   . Arava [Leflunomide] Other (See Comments)    Heart races   . Aspirin Other (See Comments)    Upset stomach, burning sensation.  . Codeine Nausea And Vomiting and Other (See Comments)    Dizzy,heart speeds up  . Darvocet [Propoxyphene N-Acetaminophen] Other (See Comments)    Heart races  . Fioricet [Butalbital-Apap-Caffeine] Other (See Comments)    Heart races.  . Levofloxacin Nausea And Vomiting  . Sertraline Hcl Other (See Comments)    Patient states "like a movie going on and can see killing someone else"  . Sulfa Antibiotics Diarrhea and Other (See Comments)    Stomach upset     Physical examination: Blood pressure (!) 142/78, pulse 68, temperature 98.5 F (36.9 C), temperature source Temporal, resp. rate 14, height 5' 2.5" (1.588 m), weight 178 lb 3.2 oz (80.8 kg), SpO2 95 %.  General: Alert, interactive, in no acute distress. HEENT: PERRLA, TMs pearly gray, turbinates non-edematous without discharge, post-pharynx non erythematous.  Several metal  fillings of her teeth.  Neck: Supple without lymphadenopathy. Lungs: Clear to auscultation without wheezing, rhonchi or rales. {no increased work of breathing. CV: Normal S1, S2 without murmurs. Abdomen: Nondistended, nontender. Skin: Warm and dry, without lesions or rashes. Extremities:  No clubbing, cyanosis or edema. Neuro:   Grossly intact.  Diagnositics/Labs: None today  Assessment and plan:   Allergic reaction secondary to contact dermatitis  -Believe that she may have had an allergic reaction that delete (T-cell mediated) in nature.  We can see this type of reactions related to a contact allergy.  Description of the history does not appear to be IgE mediated (immediate type reaction).  -I discussed with [name] today that I will be reaching out to the lab that produced her partial denture to get the ingredients of the denture and to receive a piece of the acrylic denture to be able to perform patch testing. As she did bring in the addition to the office today and I was able to review it and it did have the acrylic gum pieces as well as the metal brackets to allow for attachment. -I did reach out to [facility] and spoke with [name], office manager, to discuss receiving a piece of the acrylic denture as well as ingredient list to be able to perform patch testing.  She stated she will be looking into this request and will get back with [place] when she has been able to research the information and get [place] the answers.  She states they likely will be able to bring [place] a piece of the acrylic denture once the specific piece that they made for Ms. [name] can be reproduced.  At that time we will have Ms. [name] come back to the office to perform patch testing and will also likely need to perform metal series testing.   -I did discuss protocol for patch testing including a return to office for placement and return back to the office for  readings in 48 hours.  Also discussed patches cannot get wet  once they are placed on the back.  Patient will return to office once we receive the materials to be able to patch test.  I appreciate the opportunity to take part in [name]'s care. Please do not hesitate to contact me with questions.  Sincerely,   Prudy Feeler, MD Allergy/Immunology [facility] [place] [facility] [place]

## 2019-03-12 NOTE — Patient Instructions (Addendum)
Allergic reaction  -Believe that she may have had an allergic reaction that delete (T-cell mediated) in nature.  We can see this type of reactions related to a contact allergy.  Description of the history does not appear to be IgE mediated (immediate type reaction).  -I discussed with Chrys Racer today that I will be reaching out to the lab that produced her partial denture to get the ingredients of the denture and to receive a piece of the acrylic denture to be able to perform patch testing. As she did bring in the addition to the office today and I was able to review it and it did have the acrylic gum pieces as well as the metal brackets to allow for attachment. -I did reach out to [place] and spoke with Aggie, office manager, to discuss receiving a piece of the acrylic denture as well as ingredient list to be able to perform patch testing.  She stated she will be looking into this request and will get back with [place] when she has been able to research the information and get [place] the answers.  She states they likely will be able to bring [place] a piece of the acrylic denture once the specific piece that they made for Ms. Jamyla can be reproduced.  At that time we will have Ms. Audreanna come back to the office to perform patch testing and will also likely need to perform metal series testing.   -I did discuss protocol for patch testing including a return to office for placement and return back to the office for readings in 48 hours.  Also discussed patches cannot get wet once they are placed on the back.  Patient will return to office once we receive the materials to be able to patch test.

## 2019-03-15 ENCOUNTER — Telehealth: Payer: Self-pay | Admitting: Allergy

## 2019-03-15 NOTE — Telephone Encounter (Signed)
Can someone call [name] (see # in OV note) back today and see if they can either come back and pick of the denture block she dropped off and cut it down to the size of dime or nickel to patch to back.  The block is too big and we have no way of cutting it down.  Also if they can clip a piece of the metal as well.    Thanks.

## 2019-03-18 NOTE — Telephone Encounter (Signed)
Per Dr. Nelva Bush, okay to use the materials that were supplied.

## 2019-03-26 ENCOUNTER — Ambulatory Visit: Payer: Self-pay | Admitting: Allergy

## 2019-05-13 ENCOUNTER — Ambulatory Visit
Admission: RE | Admit: 2019-05-13 | Discharge: 2019-05-13 | Disposition: A | Discharge status: Home / Self Care | Payer: Medicare Other | Source: Ambulatory Visit | Attending: Acute Care | Admitting: Acute Care

## 2019-05-13 ENCOUNTER — Other Ambulatory Visit: Payer: Self-pay

## 2019-05-13 DIAGNOSIS — Z122 Encounter for screening for malignant neoplasm of respiratory organs: Secondary | ICD-10-CM

## 2019-05-13 DIAGNOSIS — F17210 Nicotine dependence, cigarettes, uncomplicated: Secondary | ICD-10-CM

## 2019-05-13 NOTE — Progress Notes (Signed)
Please call patient and let them  know their  low dose Ct was read as a Lung RADS 2: nodules that are benign in appearance and behavior with a very low likelihood of becoming a clinically active cancer due to size or lack of growth. Recommendation per radiology is for a repeat LDCT in 12 months. Please let them  know we will order and schedule their  annual screening scan for 05/2020. Please let them  know there was notation of CAD on their  scan.  Please remind the patient  that this is a non-gated exam therefore degree or severity of disease  cannot be determined. Please have them  follow up with their PCP regarding potential risk factor modification, dietary therapy or pharmacologic therapy if clinically indicated. Pt.  is  currently on statin therapy. Please place order for annual  screening scan for  05/2020 and fax results to PCP. Thanks so much. 

## 2019-05-14 ENCOUNTER — Telehealth: Payer: Self-pay | Admitting: Pulmonary Disease

## 2019-05-14 DIAGNOSIS — F17210 Nicotine dependence, cigarettes, uncomplicated: Secondary | ICD-10-CM

## 2019-05-14 DIAGNOSIS — Z87891 Personal history of nicotine dependence: Secondary | ICD-10-CM

## 2019-05-14 NOTE — Telephone Encounter (Signed)
Pt informed of CT results per Sarah Groce, NP.  PT verbalized understanding.  Copy sent to PCP.  Order placed for 1 yr f/u CT.  

## 2019-05-28 ENCOUNTER — Ambulatory Visit (INDEPENDENT_AMBULATORY_CARE_PROVIDER_SITE_OTHER): Payer: Medicare Other | Admitting: Cardiovascular Disease

## 2019-05-28 ENCOUNTER — Other Ambulatory Visit: Payer: Self-pay

## 2019-05-28 VITALS — BP 122/66 | HR 61 | Temp 97.2°F | Ht 62.5 in | Wt 177.2 lb

## 2019-05-28 DIAGNOSIS — I447 Left bundle-branch block, unspecified: Secondary | ICD-10-CM | POA: Diagnosis not present

## 2019-05-28 DIAGNOSIS — I771 Stricture of artery: Secondary | ICD-10-CM

## 2019-05-28 DIAGNOSIS — I10 Essential (primary) hypertension: Secondary | ICD-10-CM | POA: Diagnosis not present

## 2019-05-28 DIAGNOSIS — E7800 Pure hypercholesterolemia, unspecified: Secondary | ICD-10-CM | POA: Diagnosis not present

## 2019-05-28 DIAGNOSIS — R55 Syncope and collapse: Secondary | ICD-10-CM

## 2019-05-28 DIAGNOSIS — F17200 Nicotine dependence, unspecified, uncomplicated: Secondary | ICD-10-CM

## 2019-05-28 NOTE — Progress Notes (Signed)
Cardiology Office Note    Date:  05/30/2019   ID:  [name], [name] 10/16/1955, MRN [id number]  PCP:  Deland Pretty, MD  Cardiologist:   Sanda Klein, MD   Chief complaint:  Palpitations, near syncope   History of Present Illness:  [name] is a 64 y.o. female chronic left bundle branch block and neurally mediated syncope, predominantly vasodepressive mechanism.   She had a implantable loop recorder in place since January 2016-2019 (now explanted), which did not detect any significant arrhythmia.  She has not had full-blown syncope since the loop recorder was implanted but has occasional spells of feeling weak and has documented blood pressure in the 70s.    She has not had any cardiovascular complaints since her last appointment.  She is worried about the fact that she has a complex kidney cyst that is highly suspicious for renal cell carcinoma, but this is very slow-growing and is being managed with observation at this point.  Her urologist is Dr. Tresa Moore.  She continues to smoke varying numbers of cigarettes (between 0 and 10 daily, depending on her mood).  Her biggest complaints are related to joint stiffness and her diagnosis of rheumatoid arthritis  Recent lipid profile was excellent with an LDL cholesterol 59 and HDL of 52.  She is on combination atorvastatin and has an MI.  Glycemic control is excellent.  In Novem fact ber her hemoglobin A1c was 6% (diet only).  Her blood pressure is well controlled while taking only half of the 40-25 mg olmesartan/hydrochlorothiazide tablet.  She will have occasional days when her blood pressure is higher and she will take the whole tablet.  She has occasional dizzy spells, a handful of times a month, but is able to abort syncope by sitting down.  She has problems with psoriatic and rheumatoid arthritis improved on chronic infusions of Enbrel.  Labs are checked repeatedly in the rheumatology office.  The patient specifically denies any  chest pain at rest or with exertion, dyspnea at rest or with exertion, orthopnea, paroxysmal nocturnal dyspnea, syncope, palpitations, focal neurological deficits, intermittent claudication, lower extremity edema, unexplained weight gain, cough, hemoptysis or wheezing.  By ultrasound she has a 50 to 60% obstruction in the right subclavian artery, and her blood pressure is a little higher on the left compared to the right.  Does not have symptoms of claudication or subclavian steal.  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 She also has a long-standing left bundle branch block which has been documented at least since 2007. She had a normal echocardiogram in 2012. Normal nuclear stress test in 2011. Minimal plaque in the carotids by ultrasonography in 2011 and December 2014. She has moderate right subclavian artery stenosis. The ultrasound performed in 2014 showed a stable 50-69 % in the right subclavian artery. The blood pressure gradient is 10-20 mmHg. The peak velocity in the subclavian artery was 325 cm/s (contralateral 243 cm/s).  Loop recorder was implanted in January 2016 after an episode of syncope, but it has never shows significant arrhythmia and she has not had recurrent syncope.  Presumed vasovagal mechanism.  Past Medical History:  Diagnosis Date  . Addiction, opium ([place])   . Arthritis   . Cancer of kidney ([place])   . Chronic back pain   . COPD (chronic obstructive pulmonary disease) ([place])   . Fibromyalgia   . GERD (gastroesophageal reflux disease)   . Glaucoma   . Hypercholesteremia   .  Hyperlipemia   . Hypertension   . Hypokalemia   . Hypothyroid   . Laryngitis   . LBBB (left bundle branch block)   . Osteopenia   . Osteopenia   . Psoriasis   . Psoriatic arthritis ([place])   . Rheumatoid arthritis ([place])   . Sjogren's syndrome ([place])   . Stenosis of right subclavian artery ([place]) 12/22/2015    Past Surgical History:   Procedure Laterality Date  . ABDOMINAL HYSTERECTOMY    . APPENDECTOMY    . CARDIAC CATHETERIZATION  06/18/2002   normal  . CESAREAN SECTION    . LAPAROSCOPIC OVARIAN CYSTECTOMY    . LOOP RECORDER IMPLANT N/A 04/22/2014   Procedure: LOOP RECORDER IMPLANT;  Surgeon: Sanda Klein, MD;  Location: [facility];  Service: Cardiovascular;  Laterality: N/A;  . LOOP RECORDER REMOVAL N/A 05/17/2017   Procedure: LOOP RECORDER REMOVAL;  Surgeon: Sanda Klein, MD;  Location: [facility];  Service: Cardiovascular;  Laterality: N/A;  . NM MYOCAR PERF WALL MOTION  07/17/2009   fixed anteroseptal defect,no ischemia  . TONSILLECTOMY    . US ECHOCARDIOGRAPHY  08/05/2010   normal    Current Medications: Outpatient Medications Prior to Visit  Medication Sig Dispense Refill  . albuterol (PROAIR HFA) 108 (90 BASE) MCG/ACT inhaler Inhale 2 puffs into the lungs every 6 (six) hours as needed for wheezing or shortness of breath.    Marland Kitchen atorvastatin (LIPITOR) 40 MG tablet Take 40 mg by mouth daily.    . celecoxib (CELEBREX) 200 MG capsule Take 200 mg by mouth 2 (two) times daily.     . Cholecalciferol (VITAMIN D) 2000 UNITS tablet Take 2,000 Units by mouth 2 (two) times daily.    Marland Kitchen etanercept (ENBREL) 50 MG/ML injection Inject 50 mg into the skin 2 (two) times a week. Tuesday and Friday.    . ezetimibe (ZETIA) 10 MG tablet Take 5 mg by mouth daily.    Marland Kitchen gabapentin (NEURONTIN) 600 MG tablet Take 600 mg by mouth 4 (four) times daily.  5  . levothyroxine (SYNTHROID, LEVOTHROID) 100 MCG tablet Take 100 mcg by mouth daily before breakfast.    . LOTEMAX 0.5 % GEL Apply 1 drop to eye 2 (two) times daily.    . methadone (DOLOPHINE) 10 MG tablet Take 10 mg by mouth 4 (four) times daily.     Marland Kitchen olmesartan-hydrochlorothiazide (BENICAR HCT) 40-25 MG tablet Take 1 tablet by mouth daily.    . Omega-3 Fatty Acids (FISH OIL) 1000 MG CAPS Take 1,000 mg by mouth 2 (two) times daily.    Marland Kitchen oxyCODONE-acetaminophen (ENDOCET)  10-325 MG per tablet Take 1 tablet by mouth 3 (three) times daily as needed for pain.    Marland Kitchen tiZANidine (ZANAFLEX) 4 MG tablet Take 4 mg by mouth 4 (four) times daily.     . traZODone (DESYREL) 50 MG tablet Take 50 mg by mouth at bedtime.    . RESTASIS 0.05 % ophthalmic emulsion SMARTSIG:1 Drop(s) In Eye(s) Every 12 Hours     No facility-administered medications prior to visit.     Allergies:   Adalimumab, Arava [leflunomide], Aspirin, Codeine, Darvocet [propoxyphene n-acetaminophen], Fioricet [butalbital-apap-caffeine], Levofloxacin, Sertraline hcl, and Sulfa antibiotics   Social History   Socioeconomic History  . Marital status: Divorced    Spouse name: Not on file  . Number of children: 2  . Years of education: Not on file  . Highest education level: Not on file  Occupational History  . Occupation: Disabled  Tobacco Use  . Smoking status: Current Every Day Smoker    Packs/day: 0.80    Years: 48.00    Pack years: 38.40    Types: Cigarettes  . Smokeless tobacco: Never Used  Substance and Sexual Activity  . Alcohol use: No    Alcohol/week: 0.0 standard drinks  . Drug use: No  . Sexual activity: Not on file  Other Topics Concern  . Not on file  Social History Narrative   Drinks 4-6 cups of coffee a day and 2 sodas    Social Determinants of Health   Financial Resource Strain:   . Difficulty of Paying Living Expenses: Not on file  Food Insecurity:   . Worried About Charity fundraiser in the Last Year: Not on file  . Ran Out of Food in the Last Year: Not on file  Transportation Needs:   . Lack of Transportation (Medical): Not on file  . Lack of Transportation (Non-Medical): Not on file  Physical Activity:   . Days of Exercise per Week: Not on file  . Minutes of Exercise per Session: Not on file  Stress:   . Feeling of Stress : Not on file  Social Connections:   . Frequency of Communication with Friends and Family: Not on file  . Frequency of Social Gatherings with  Friends and Family: Not on file  . Attends Religious Services: Not on file  . Active Member of Clubs or Organizations: Not on file  . Attends Archivist Meetings: Not on file  . Marital Status: Not on file     Family History:  The patient's family history includes Asthma in her father; Cancer in her brother and brother; Diabetes in her father and mother; Heart failure in her father and mother; Hypertension in her brother, father, mother, and sister.   ROS:   Please see the history of present illness.    ROS All other systems are reviewed and are negative.   PHYSICAL EXAM:   VS:  BP 122/66   Pulse 61   Temp (!) 97.2 F (36.2 C)   Ht 5' 2.5" (1.588 m)   Wt 177 lb 3.2 oz (80.4 kg)   BMI 31.89 kg/m     As always, the blood pressure in the right upper extremity is roughly 10 mmHg lower compared to the left.   General: Alert, oriented x3, no distress, mildly obese Head: no evidence of trauma, PERRL, EOMI, no exophtalmos or lid lag, no myxedema, no xanthelasma; normal ears, nose and oropharynx Neck: normal jugular venous pulsations and no hepatojugular reflux; brisk carotid pulses without delay and no carotid bruits Chest: clear to auscultation, no signs of consolidation by percussion or palpation, normal fremitus, symmetrical and full respiratory excursions Cardiovascular: normal position and quality of the apical impulse, regular rhythm, normal first and paradoxically split second heart sounds, no murmurs, rubs or gallops Abdomen: no tenderness or distention, no masses by palpation, no abnormal pulsatility or arterial bruits, normal bowel sounds, no hepatosplenomegaly Extremities: no clubbing, cyanosis or edema; deformed joints primarily in the metacarpophalangeal and proximal interphalangeal joints 2+ radial, ulnar and brachial pulses bilaterally; 2+ right femoral, posterior tibial and dorsalis pedis pulses; 2+ left femoral, posterior tibial and dorsalis pedis pulses; no  subclavian or femoral bruits Neurological: grossly nonfocal Psych: Normal mood and affect    Wt Readings from Last 3 Encounters:  05/28/19 177 lb 3.2 oz (80.4 kg)  03/12/19 178 lb 3.2 oz (80.8 kg)  09/10/18 185 lb 3.2  oz (84 kg)      Studies/Labs Reviewed:   EKG:  EKG is ordered today.  It shows normal sinus rhythm and old left bundle branch block. Recent Labs: No results found for requested labs within last 8760 hours.   Lipid Panel    Component Value Date/Time   CHOL  08/14/2008 0210    139        ATP III CLASSIFICATION:  <200     mg/dL   Desirable  200-239  mg/dL   Borderline High  >=240    mg/dL   High          TRIG 125 08/14/2008 0210   HDL 28 (L) 08/14/2008 0210   CHOLHDL 5.0 08/14/2008 0210   VLDL 25 08/14/2008 0210   LDLCALC  08/14/2008 0210    86        Total Cholesterol/HDL:CHD Risk Coronary Heart Disease Risk Table                     Men   Women  1/2 Average Risk   3.4   3.3  Average Risk       5.0   4.4  2 X Average Risk   9.6   7.1  3 X Average Risk  23.4   11.0        Use the calculated Patient Ratio above and the CHD Risk Table to determine the patient's CHD Risk.        ATP III CLASSIFICATION (LDL):  <100     mg/dL   Optimal  100-129  mg/dL   Near or Above                    Optimal  130-159  mg/dL   Borderline  160-189  mg/dL   High  >190     mg/dL   Very High    Labs from February 08, 2018 Total cholesterol 115, triglycerides 95, HDL 47, LDL 49 Normal liver function tests, potassium 4.2, creatinine 0.8, hemoglobin 13.6  02/15/2019  total cholesterol 125, HDL 52, LDL 59, triglycerides 69 hemoglobin A1c 6% 04/08/2019  hemoglobin 13.3, creatinine 0.75  ASSESSMENT:    1. Neurocardiogenic pre-syncope   2. LBBB (left bundle branch block)   3. Stenosis of right subclavian artery ([place])   4. Hypercholesterolemia   5. Essential hypertension   6. Smoking      PLAN:  In order of problems listed above:  1. Syncope: She has not had  full-blown syncope in about 5 years.  The mechanism appears to be primarily vasodepressor.  Reviewed the importance of laying down (preferable to just sitting down) when she has prodromal symptoms.  I am glad she is taking a lower dose of diuretic.  Avoid dehydration. 2. LBBB: During 3 years of loop recorder monitoring she never had evidence of advanced AV block.  The possibility of progression to second or third-degree AV block needs to be kept in mind if she has recurrent syncope, especially if this occurs without prodrome 3. Right subclavian stenosis: She does not have symptoms of subclavian steal syndrome or upper extremity claudication.  Stable 10-20 mmHg gradient between the right and left arm blood pressures.  Always check blood pressure in the left arm. 4. HLP: Excellent lipid profile on current atorvastatin/ezetimibe combination 5. HTN: Well-controlled on half of the 40/25 Benicar HCT tablet. 6. Tobacco abuse: Although she has cut back substantially on her smoking she has not succeeded in quitting altogether.  I encouraged her to keep trying.    Medication Adjustments/Labs and Tests Ordered: Current medicines are reviewed at length with the patient today.  Concerns regarding medicines are outlined above.  Medication changes, Labs and Tests ordered today are listed in the Patient Instructions below. Patient Instructions  Medication Instructions:  No changes *If you need a refill on your cardiac medications before your next appointment, please call your pharmacy*  Lab Work: None ordered If you have labs (blood work) drawn today and your tests are completely normal, you will receive your results only by: Marland Kitchen MyChart Message (if you have MyChart) OR . A paper copy in the mail If you have any lab test that is abnormal or we need to change your treatment, we will call you to review the results.  Testing/Procedures: Your physician has requested that you have a carotid duplex. This test is an  ultrasound of the carotid arteries in your neck. It looks at blood flow through these arteries that supply the brain with blood. Allow one hour for this exam. There are no restrictions or special instructions. This will take place at [place], [address].  Follow-Up: At [facility], you and your health needs are our priority.  As part of our continuing mission to provide you with exceptional heart care, we have created designated Provider Care Teams.  These Care Teams include your primary Cardiologist (physician) and Advanced Practice Providers (APPs -  Physician Assistants and Nurse Practitioners) who all work together to provide you with the care you need, when you need it.  Your next appointment:   12 month(s)  The format for your next appointment:   In Person  Provider:   You may see Sanda Klein, MD or one of the following Advanced Practice Providers on your designated Care Team:    Almyra Deforest, PA-C  Fabian Sharp, [place] or   Roby Lofts, PA-C     Signed, Sanda Klein, MD  05/30/2019 3:11 PM    [address], [place], [place]  [address] Phone: [phone]; Fax: [phone]

## 2019-05-28 NOTE — Patient Instructions (Signed)
Medication Instructions:  No changes *If you need a refill on your cardiac medications before your next appointment, please call your pharmacy*  Lab Work: None ordered If you have labs (blood work) drawn today and your tests are completely normal, you will receive your results only by: Marland Kitchen MyChart Message (if you have MyChart) OR . A paper copy in the mail If you have any lab test that is abnormal or we need to change your treatment, we will call you to review the results.  Testing/Procedures: Your physician has requested that you have a carotid duplex. This test is an ultrasound of the carotid arteries in your neck. It looks at blood flow through these arteries that supply the brain with blood. Allow one hour for this exam. There are no restrictions or special instructions. This will take place at [place], [address].  Follow-Up: At [facility], you and your health needs are our priority.  As part of our continuing mission to provide you with exceptional heart care, we have created designated Provider Care Teams.  These Care Teams include your primary Cardiologist (physician) and Advanced Practice Providers (APPs -  Physician Assistants and Nurse Practitioners) who all work together to provide you with the care you need, when you need it.  Your next appointment:   12 month(s)  The format for your next appointment:   In Person  Provider:   You may see Sanda Klein, MD or one of the following Advanced Practice Providers on your designated Care Team:    Almyra Deforest, PA-C  Fabian Sharp, PA-C or   Roby Lofts, [place]

## 2019-05-30 ENCOUNTER — Encounter: Payer: Self-pay | Admitting: Cardiovascular Disease

## 2019-05-31 ENCOUNTER — Ambulatory Visit (HOSPITAL_COMMUNITY)
Admission: RE | Admit: 2019-05-31 | Discharge: 2019-05-31 | Disposition: A | Discharge status: Home / Self Care | Payer: Medicare Other | Source: Ambulatory Visit | Attending: Cardiology | Admitting: Cardiology

## 2019-05-31 ENCOUNTER — Other Ambulatory Visit: Payer: Self-pay

## 2019-05-31 DIAGNOSIS — I771 Stricture of artery: Secondary | ICD-10-CM

## 2019-06-03 ENCOUNTER — Other Ambulatory Visit: Payer: Self-pay | Admitting: *Deleted

## 2019-06-03 DIAGNOSIS — I771 Stricture of artery: Secondary | ICD-10-CM

## 2019-06-12 ENCOUNTER — Other Ambulatory Visit: Payer: Self-pay | Admitting: Pulmonary Disease

## 2019-06-12 DIAGNOSIS — R7981 Abnormal blood-gas level: Secondary | ICD-10-CM

## 2019-11-29 ENCOUNTER — Other Ambulatory Visit: Payer: Self-pay | Admitting: Endocrinology

## 2019-11-29 DIAGNOSIS — E041 Nontoxic single thyroid nodule: Secondary | ICD-10-CM

## 2019-12-09 ENCOUNTER — Ambulatory Visit
Admission: RE | Admit: 2019-12-09 | Discharge: 2019-12-09 | Disposition: A | Discharge status: Home / Self Care | Payer: Medicare Other | Source: Ambulatory Visit | Attending: Endocrinology | Admitting: Endocrinology

## 2019-12-09 DIAGNOSIS — E041 Nontoxic single thyroid nodule: Secondary | ICD-10-CM

## 2020-03-03 ENCOUNTER — Telehealth: Payer: Self-pay | Admitting: Cardiovascular Disease

## 2020-03-03 NOTE — Telephone Encounter (Signed)
STAT if patient feels like he/she is going to faint   1) Are you dizzy now? no  2) Do you feel faint or have you passed out? no  3) Do you have any other symptoms? no  4) Have you checked your HR and BP (record if available)?   03/02/20 6:00 pm: 76/43  03/02/20 9:00 pm: 110/69   Pt said this happened last night. She said it took about 3 hours for her BP to go back to normal. She did not want to go to the ER. She said this hasn't happened in about 12 years so she wanted to come in to be checked out by Dr. Loletha Grayer

## 2020-03-03 NOTE — Telephone Encounter (Signed)
Returned the call to the patient. She stated that yesterday she was talking on the phone and she felt like someone was trying to choke her. She experienced shortness of breath and became light headed which lasted 10-15 minutes. She denied any chest pain but did state that she had a pain along the left side of her neck. She took her blood pressure when the episode ended at it was 76/42 with a pulse of 84. She stated that this happened 10-12 years ago which was why she started seeing Dr. Sallyanne Kuster.   She denies any changes in her diet and no medication changes.   She also stated that she feels like by the end of the day she is "swollen with fluid" from her stomach down to her feet. She does not check her weight at home and stated that her pants get tight. She has not been urinating as much. She drinks 4-6 cups of coffee, 2 cans of soda and 3-4 bottles of water daily with little urine output.   She stated that she feels fine today. Blood pressure while on the phone was 133/69 and pulse was 61.

## 2020-03-04 NOTE — Telephone Encounter (Signed)
The patient has been made aware. She stated that if it does happen again then she will go to the ED.  She stated that she has felt fine today.

## 2020-03-04 NOTE — Telephone Encounter (Signed)
Sounds like another episode of neurocardiogenic near-syncope. Sounds like she may need to hydrate even more (the caffeine in her coffee and sodas may actually keep her dehydrated).

## 2020-05-27 ENCOUNTER — Other Ambulatory Visit: Payer: Self-pay

## 2020-05-27 ENCOUNTER — Ambulatory Visit (INDEPENDENT_AMBULATORY_CARE_PROVIDER_SITE_OTHER): Payer: Medicare Other | Admitting: Cardiovascular Disease

## 2020-05-27 VITALS — BP 124/58 | HR 61 | Ht 63.0 in | Wt 178.0 lb

## 2020-05-27 DIAGNOSIS — E7800 Pure hypercholesterolemia, unspecified: Secondary | ICD-10-CM | POA: Diagnosis not present

## 2020-05-27 DIAGNOSIS — I447 Left bundle-branch block, unspecified: Secondary | ICD-10-CM

## 2020-05-27 DIAGNOSIS — R55 Syncope and collapse: Secondary | ICD-10-CM

## 2020-05-27 DIAGNOSIS — Z720 Tobacco use: Secondary | ICD-10-CM

## 2020-05-27 DIAGNOSIS — I771 Stricture of artery: Secondary | ICD-10-CM | POA: Diagnosis not present

## 2020-05-27 DIAGNOSIS — I10 Essential (primary) hypertension: Secondary | ICD-10-CM

## 2020-05-27 MED ORDER — OLMESARTAN MEDOXOMIL-HCTZ 40-12.5 MG PO TABS: 1.0000 | ORAL_TABLET | Freq: Every day | ORAL | 11 refills | Status: DC

## 2020-05-27 MED ORDER — HYDROCHLOROTHIAZIDE 12.5 MG PO CAPS: 12.5000 mg | ORAL_CAPSULE | ORAL | 0 refills | Status: DC | PRN

## 2020-05-27 NOTE — Progress Notes (Signed)
Cardiology Office Note    Date:  05/27/2020   ID:  Haley, Johnston 1955-05-19, MRN [id number]  PCP:  Deland Pretty, MD  Cardiologist:   Sanda Klein, MD   Chief complaint:  Palpitations, near syncope   History of Present Illness:  [name] is a 65 y.o. female chronic left bundle branch block and neurally mediated syncope, predominantly vasodepressive mechanism.   She had a implantable loop recorder in place since January 2016-2019 (now explanted), which did not detect any significant arrhythmia.  She has not had full-blown syncope since the loop recorder was implanted but has occasional spells of feeling weak and has documented blood pressure in the 70s.    Overall she has done well, but she has still had occasional episodes of hypotension and recorded blood pressure as low as 88/43 mmHg.  As a consequence, she has been taking "breaks" from her antihypertensive medication recently.  She has not had full-blown syncope or any falls.  She is taking olmesartan and hydrochlorothiazide and has a history of numerous intolerances to other antihypertensive medications and claims that only certain generic medication worked well for her.  Her dose of hydrochlorothiazide was increased overall back due to complaints of edema.  She has not had any edema recently.  The patient specifically denies any chest pain at rest or with exertion, dyspnea at rest or with exertion, orthopnea, paroxysmal nocturnal dyspnea, syncope,  focal neurological deficits, intermittent claudication, recent lower extremity edema, unexplained weight gain, cough, hemoptysis or wheezing.  She has rare palpitations.  Biggest complaints are related to joint stiffness.  She continues to smoke a few cigarettes a day he does not smoke follow-up with days of the week.  She uses this to cope with her anxiety.  She has problems with psoriatic and rheumatoid arthritis improved on chronic infusions of Enbrel.  Labs are checked  repeatedly in the rheumatology office.  By ultrasound she has a 50 to 60% obstruction in the right subclavian artery, and her blood pressure is a little higher on the left compared to the right.  Does not have symptoms of claudication or subclavian steal.  Mrs. 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 She also has a long-standing left bundle branch block which has been documented at least since 2007. She had a normal echocardiogram in 2012. Normal nuclear stress test in 2011. Minimal plaque in the carotids by ultrasonography in 2011 and December 2014. She has moderate right subclavian artery stenosis. The ultrasound performed in 2014 showed a stable 50-69 % in the right subclavian artery. The blood pressure gradient is 10-20 mmHg. The peak velocity in the subclavian artery was 325 cm/s (contralateral 243 cm/s).  Loop recorder was implanted in January 2016 after an episode of syncope, but it has never shows significant arrhythmia and she has not had recurrent syncope.  Presumed vasovagal mechanism.  Past Medical History:  Diagnosis Date  . Addiction, opium ([place])   . Arthritis   . Cancer of kidney ([place])   . Chronic back pain   . COPD (chronic obstructive pulmonary disease) ([place])   . Fibromyalgia   . GERD (gastroesophageal reflux disease)   . Glaucoma   . Hypercholesteremia   . Hyperlipemia   . Hypertension   . Hypokalemia   . Hypothyroid   . Laryngitis   . LBBB (left bundle branch block)   . Osteopenia   . Osteopenia   . Psoriasis   . Psoriatic arthritis ([place])   .  Rheumatoid arthritis ([place])   . Sjogren's syndrome ([place])   . Stenosis of right subclavian artery ([place]) 12/22/2015    Past Surgical History:  Procedure Laterality Date  . ABDOMINAL HYSTERECTOMY    . APPENDECTOMY    . CARDIAC CATHETERIZATION  06/18/2002   normal  . CESAREAN SECTION    . LAPAROSCOPIC OVARIAN CYSTECTOMY    . LOOP RECORDER IMPLANT N/A 04/22/2014    Procedure: LOOP RECORDER IMPLANT;  Surgeon: Sanda Klein, MD;  Location: [facility];  Service: Cardiovascular;  Laterality: N/A;  . LOOP RECORDER REMOVAL N/A 05/17/2017   Procedure: LOOP RECORDER REMOVAL;  Surgeon: Sanda Klein, MD;  Location: [facility];  Service: Cardiovascular;  Laterality: N/A;  . NM MYOCAR PERF WALL MOTION  07/17/2009   fixed anteroseptal defect,no ischemia  . TONSILLECTOMY    . US ECHOCARDIOGRAPHY  08/05/2010   normal    Current Medications: Outpatient Medications Prior to Visit  Medication Sig Dispense Refill  . albuterol (VENTOLIN HFA) 108 (90 Base) MCG/ACT inhaler Inhale 2 puffs into the lungs every 6 (six) hours as needed for wheezing or shortness of breath.    Marland Kitchen atorvastatin (LIPITOR) 40 MG tablet Take 40 mg by mouth daily.    . celecoxib (CELEBREX) 200 MG capsule Take 200 mg by mouth 2 (two) times daily.    . Cholecalciferol (VITAMIN D) 2000 UNITS tablet Take 2,000 Units by mouth 2 (two) times daily.    Marland Kitchen etanercept (ENBREL) 50 MG/ML injection Inject 50 mg into the skin 2 (two) times a week. Tuesday and Friday.    . ezetimibe (ZETIA) 10 MG tablet Take 5 mg by mouth daily.    Marland Kitchen gabapentin (NEURONTIN) 600 MG tablet Take 600 mg by mouth 4 (four) times daily.  5  . levothyroxine (SYNTHROID, LEVOTHROID) 100 MCG tablet Take 100 mcg by mouth daily before breakfast.    . LOTEMAX 0.5 % GEL Apply 1 drop to eye 2 (two) times daily.    . methadone (DOLOPHINE) 10 MG tablet Take 10 mg by mouth 4 (four) times daily.     . Omega-3 Fatty Acids (FISH OIL) 1000 MG CAPS Take 1,000 mg by mouth 2 (two) times daily.    Marland Kitchen oxyCODONE-acetaminophen (PERCOCET) 10-325 MG tablet Take 1 tablet by mouth 3 (three) times daily as needed for pain.    Marland Kitchen tiZANidine (ZANAFLEX) 4 MG tablet Take 4 mg by mouth 4 (four) times daily.    . traZODone (DESYREL) 50 MG tablet Take 50 mg by mouth at bedtime.    Marland Kitchen olmesartan-hydrochlorothiazide (BENICAR HCT) 40-25 MG tablet Take 1 tablet by mouth  daily.     No facility-administered medications prior to visit.     Allergies:   Adalimumab, Arava [leflunomide], Aspirin, Codeine, Darvocet [propoxyphene n-acetaminophen], Fioricet [butalbital-apap-caffeine], Levofloxacin, Sertraline hcl, and Sulfa antibiotics   Social History   Socioeconomic History  . Marital status: Divorced    Spouse name: Not on file  . Number of children: 2  . Years of education: Not on file  . Highest education level: Not on file  Occupational History  . Occupation: Disabled   Tobacco Use  . Smoking status: Current Every Day Smoker    Packs/day: 0.80    Years: 48.00    Pack years: 38.40    Types: Cigarettes  . Smokeless tobacco: Never Used  Substance and Sexual Activity  . Alcohol use: No    Alcohol/week: 0.0 standard drinks  . Drug use: No  . Sexual activity: Not on  file  Other Topics Concern  . Not on file  Social History Narrative   Drinks 4-6 cups of coffee a day and 2 sodas    Social Determinants of Health   Financial Resource Strain: Not on file  Food Insecurity: Not on file  Transportation Needs: Not on file  Physical Activity: Not on file  Stress: Not on file  Social Connections: Not on file     Family History:  The patient's family history includes Asthma in her father; Cancer in her brother and brother; Diabetes in her father and mother; Heart failure in her father and mother; Hypertension in her brother, father, mother, and sister.   ROS:   Please see the history of present illness.    ROS All other systems are reviewed and are negative.   PHYSICAL EXAM:   VS:  BP (!) 124/58 (BP Location: Left Arm, Patient Position: Sitting, Cuff Size: Normal)   Pulse 61   Ht 5\' 3"  (1.6 m)   Wt 178 lb (80.7 kg)   BMI 31.53 kg/m     Blood pressure is 10 mmHg lower on the right.   General: Alert, oriented x3, no distress, mildly obese Head: no evidence of trauma, PERRL, EOMI, no exophtalmos or lid lag, no myxedema, no xanthelasma; normal  ears, nose and oropharynx Neck: normal jugular venous pulsations and no hepatojugular reflux; brisk carotid pulses without delay.  There is a faint bruit overlying the base of the neck and subclavian area on the right. Chest: clear to auscultation, no signs of consolidation by percussion or palpation, normal fremitus, symmetrical and full respiratory excursions Cardiovascular: normal position and quality of the apical impulse, regular rhythm, normal first and second heart sounds, no murmurs, rubs or gallops Abdomen: no tenderness or distention, no masses by palpation, no abnormal pulsatility or arterial bruits, normal bowel sounds, no hepatosplenomegaly Extremities: no clubbing, cyanosis or edema; 2+ radial, ulnar and brachial pulses bilaterally; 2+ right femoral, posterior tibial and dorsalis pedis pulses; 2+ left femoral, posterior tibial and dorsalis pedis pulses; right subclavian bruit Neurological: grossly nonfocal Psych: Normal mood and affect   Wt Readings from Last 3 Encounters:  05/27/20 178 lb (80.7 kg)  05/28/19 177 lb 3.2 oz (80.4 kg)  03/12/19 178 lb 3.2 oz (80.8 kg)      Studies/Labs Reviewed:   EKG:  EKG is ordered today.  It shows normal sinus rhythm and chronic left bundle branch block.  QRS 152 ms, QTC 452 ms Recent Labs: No results found for requested labs within last 8760 hours.  02/19/19/2021 TSH 0.79 702 2021 creatinine 0.4 Lipid Panel    Component Value Date/Time   CHOL  08/14/2008 0210    139        ATP III CLASSIFICATION:  <200     mg/dL   Desirable  200-239  mg/dL   Borderline High  >=240    mg/dL   High          TRIG 125 08/14/2008 0210   HDL 28 (L) 08/14/2008 0210   CHOLHDL 5.0 08/14/2008 0210   VLDL 25 08/14/2008 0210   LDLCALC  08/14/2008 0210    86        Total Cholesterol/HDL:CHD Risk Coronary Heart Disease Risk Table                     Men   Women  1/2 Average Risk   3.4   3.3  Average Risk  5.0   4.4  2 X Average Risk   9.6   7.1   3 X Average Risk  23.4   11.0        Use the calculated Patient Ratio above and the CHD Risk Table to determine the patient's CHD Risk.        ATP III CLASSIFICATION (LDL):  <100     mg/dL   Optimal  100-129  mg/dL   Near or Above                    Optimal  130-159  mg/dL   Borderline  160-189  mg/dL   High  >190     mg/dL   Very High   02/15/2019  total cholesterol 125, HDL 52, LDL 59, triglycerides 69 hemoglobin A1c 6%  02/19/2020 total cholesterol 124, HDL cholesterol 54, LDL cholesterol 56, triglycerides 44  ASSESSMENT:    1. Neurocardiogenic pre-syncope   2. LBBB (left bundle branch block)   3. Stenosis of right subclavian artery ([place])   4. Pure hypercholesterolemia   5. Essential hypertension   6. Tobacco abuse      PLAN:  In order of problems listed above:  1. Syncope: Its been at least 6 years since she had full-blown syncope.  Need to limit the dose of diuretic.  The mechanism appears to be primarily vasodepressor.  Reviewed the importance of laying down (preferable to just sitting down) when she has prodromal symptoms.  2. LBBB: No change over the years.  During 3 years of loop recorder monitoring she never had evidence of advanced AV block.  The possibility of progression to second or third-degree AV block needs to be kept in mind if she has recurrent syncope, especially if this occurs without prodrome 3. Right subclavian stenosis: None of her symptoms suggest subclavian steal syndrome or upper extremity claudication..  Stable 10-20 mmHg gradient between the right and left arm blood pressures.  Always check blood pressure in the left arm. 4. HLP: Excellent lipid profile, continue the current atorvastatin-ezetimibe combination. 5. HTN: I think the current dose of antihypertensive medication lowers her blood pressure excessively.  I am particularly concerned by the fact that she takes diuretics.  Will reduce to the olmesartan-hydrochlorothiazide 40-12.5 mg dose.  Okay  to take an extra 12.5 mg of hydrochlorothiazide once or twice a week if this is necessary for edema, but I would use this very sparingly. 6. Tobacco abuse: Although she has cut back substantially on her smoking she has not succeeded in quitting altogether.  Again reminded her that it is really important for her to quit smoking completely.    Medication Adjustments/Labs and Tests Ordered: Current medicines are reviewed at length with the patient today.  Concerns regarding medicines are outlined above.  Medication changes, Labs and Tests ordered today are listed in the Patient Instructions below. Patient Instructions  Medication Instructions:  CHANGE: how you take the Olmesartan-Hydrochlorothiazide to 40-12.5 mg once daily  TAKE-Hydrochlorothiazide 12.5 mg 1-2 times weekly as needed for swelling  *If you need a refill on your cardiac medications before your next appointment, please call your pharmacy*   Lab Work: None ordered If you have labs (blood work) drawn today and your tests are completely normal, you will receive your results only by: Marland Kitchen MyChart Message (if you have MyChart) OR . A paper copy in the mail If you have any lab test that is abnormal or we need to change your treatment, we will call  you to review the results.   Testing/Procedures: None ordered   Follow-Up: At [facility], you and your health needs are our priority.  As part of our continuing mission to provide you with exceptional heart care, we have created designated Provider Care Teams.  These Care Teams include your primary Cardiologist (physician) and Advanced Practice Providers (APPs -  Physician Assistants and Nurse Practitioners) who all work together to provide you with the care you need, when you need it.  We recommend signing up for the patient portal called "MyChart".  Sign up information is provided on this After Visit Summary.  MyChart is used to connect with patients for Virtual Visits (Telemedicine).   Patients are able to view lab/test results, encounter notes, upcoming appointments, etc.  Non-urgent messages can be sent to your provider as well.   To learn more about what you can do with MyChart, go to [email or web address].    Your next appointment:   12 month(s)  The format for your next appointment:   In Person  Provider:   You may see Sanda Klein, MD or one of the following Advanced Practice Providers on your designated Care Team:    Almyra Deforest, PA-C  Fabian Sharp, [place] or   Roby Lofts, PA-C        Signed, Sanda Klein, MD  05/27/2020 1:06 PM    [address], [place], [place]  [address] Phone: [phone]; Fax: [phone]

## 2020-05-27 NOTE — Patient Instructions (Signed)
Medication Instructions:  CHANGE: how you take the Olmesartan-Hydrochlorothiazide to 40-12.5 mg once daily  TAKE-Hydrochlorothiazide 12.5 mg 1-2 times weekly as needed for swelling  *If you need a refill on your cardiac medications before your next appointment, please call your pharmacy*   Lab Work: None ordered If you have labs (blood work) drawn today and your tests are completely normal, you will receive your results only by: Marland Kitchen MyChart Message (if you have MyChart) OR . A paper copy in the mail If you have any lab test that is abnormal or we need to change your treatment, we will call you to review the results.   Testing/Procedures: None ordered   Follow-Up: At [facility], you and your health needs are our priority.  As part of our continuing mission to provide you with exceptional heart care, we have created designated Provider Care Teams.  These Care Teams include your primary Cardiologist (physician) and Advanced Practice Providers (APPs -  Physician Assistants and Nurse Practitioners) who all work together to provide you with the care you need, when you need it.  We recommend signing up for the patient portal called "MyChart".  Sign up information is provided on this After Visit Summary.  MyChart is used to connect with patients for Virtual Visits (Telemedicine).  Patients are able to view lab/test results, encounter notes, upcoming appointments, etc.  Non-urgent messages can be sent to your provider as well.   To learn more about what you can do with MyChart, go to [email or web address].    Your next appointment:   12 month(s)  The format for your next appointment:   In Person  Provider:   You may see Sanda Klein, MD or one of the following Advanced Practice Providers on your designated Care Team:    Almyra Deforest, PA-C  Fabian Sharp, PA-C or   Roby Lofts, [place]

## 2020-06-01 ENCOUNTER — Other Ambulatory Visit: Payer: Self-pay | Admitting: *Deleted

## 2020-06-01 DIAGNOSIS — Z87891 Personal history of nicotine dependence: Secondary | ICD-10-CM

## 2020-06-01 DIAGNOSIS — F17210 Nicotine dependence, cigarettes, uncomplicated: Secondary | ICD-10-CM

## 2020-06-03 ENCOUNTER — Other Ambulatory Visit: Payer: Self-pay

## 2020-06-03 ENCOUNTER — Ambulatory Visit (HOSPITAL_COMMUNITY)
Admission: RE | Admit: 2020-06-03 | Discharge: 2020-06-03 | Disposition: A | Discharge status: Home / Self Care | Payer: Medicare Other | Source: Ambulatory Visit | Attending: Cardiovascular Disease | Admitting: Cardiovascular Disease

## 2020-06-03 DIAGNOSIS — I771 Stricture of artery: Secondary | ICD-10-CM | POA: Insufficient documentation

## 2020-06-19 ENCOUNTER — Ambulatory Visit
Admission: RE | Admit: 2020-06-19 | Discharge: 2020-06-19 | Disposition: A | Discharge status: Home / Self Care | Payer: Medicare Other | Source: Ambulatory Visit | Attending: Acute Care | Admitting: Acute Care

## 2020-06-19 DIAGNOSIS — F17210 Nicotine dependence, cigarettes, uncomplicated: Secondary | ICD-10-CM

## 2020-06-19 DIAGNOSIS — Z87891 Personal history of nicotine dependence: Secondary | ICD-10-CM

## 2020-06-20 ENCOUNTER — Other Ambulatory Visit: Payer: Self-pay | Admitting: Cardiovascular Disease

## 2020-06-25 NOTE — Progress Notes (Signed)

## 2020-06-30 ENCOUNTER — Other Ambulatory Visit: Payer: Self-pay | Admitting: *Deleted

## 2020-06-30 DIAGNOSIS — F17210 Nicotine dependence, cigarettes, uncomplicated: Secondary | ICD-10-CM

## 2020-06-30 DIAGNOSIS — Z87891 Personal history of nicotine dependence: Secondary | ICD-10-CM

## 2020-10-14 ENCOUNTER — Other Ambulatory Visit: Payer: Self-pay | Admitting: Urology

## 2020-10-14 DIAGNOSIS — N281 Cyst of kidney, acquired: Secondary | ICD-10-CM

## 2020-10-25 ENCOUNTER — Ambulatory Visit
Admission: RE | Admit: 2020-10-25 | Discharge: 2020-10-25 | Disposition: A | Discharge status: Home / Self Care | Payer: Medicare Other | Source: Ambulatory Visit | Attending: Urology | Admitting: Urology

## 2020-10-25 DIAGNOSIS — N281 Cyst of kidney, acquired: Secondary | ICD-10-CM

## 2020-10-25 MED ORDER — GADOBENATE DIMEGLUMINE 529 MG/ML IV SOLN
15.0000 mL | Freq: Once | INTRAVENOUS | Status: AC | PRN
  Administered 2020-10-25: 15 mL via INTRAVENOUS

## 2020-12-16 ENCOUNTER — Other Ambulatory Visit: Payer: Self-pay | Admitting: Cardiovascular Disease

## 2020-12-18 ENCOUNTER — Other Ambulatory Visit: Payer: Self-pay | Admitting: Endocrinology

## 2020-12-18 DIAGNOSIS — E039 Hypothyroidism, unspecified: Secondary | ICD-10-CM

## 2020-12-23 ENCOUNTER — Ambulatory Visit
Admission: RE | Admit: 2020-12-23 | Discharge: 2020-12-23 | Disposition: A | Discharge status: Home / Self Care | Payer: Medicare Other | Source: Ambulatory Visit | Attending: Endocrinology | Admitting: Endocrinology

## 2020-12-23 DIAGNOSIS — E039 Hypothyroidism, unspecified: Secondary | ICD-10-CM

## 2021-01-21 ENCOUNTER — Other Ambulatory Visit (HOSPITAL_COMMUNITY): Payer: Self-pay | Admitting: Cardiovascular Disease

## 2021-01-21 DIAGNOSIS — I6523 Occlusion and stenosis of bilateral carotid arteries: Secondary | ICD-10-CM

## 2021-06-08 ENCOUNTER — Ambulatory Visit: Payer: Medicare Other | Admitting: Sports Medicine

## 2021-06-11 ENCOUNTER — Ambulatory Visit (INDEPENDENT_AMBULATORY_CARE_PROVIDER_SITE_OTHER): Payer: Medicare Other | Admitting: Sports Medicine

## 2021-06-11 ENCOUNTER — Ambulatory Visit (INDEPENDENT_AMBULATORY_CARE_PROVIDER_SITE_OTHER): Payer: Medicare Other

## 2021-06-11 ENCOUNTER — Other Ambulatory Visit: Payer: Self-pay

## 2021-06-11 DIAGNOSIS — M2141 Flat foot [pes planus] (acquired), right foot: Secondary | ICD-10-CM

## 2021-06-11 DIAGNOSIS — M7751 Other enthesopathy of right foot: Secondary | ICD-10-CM

## 2021-06-11 DIAGNOSIS — M21962 Unspecified acquired deformity of left lower leg: Secondary | ICD-10-CM

## 2021-06-11 DIAGNOSIS — M79671 Pain in right foot: Secondary | ICD-10-CM | POA: Diagnosis not present

## 2021-06-11 DIAGNOSIS — M7752 Other enthesopathy of left foot: Secondary | ICD-10-CM

## 2021-06-11 DIAGNOSIS — M25571 Pain in right ankle and joints of right foot: Secondary | ICD-10-CM

## 2021-06-11 DIAGNOSIS — M25572 Pain in left ankle and joints of left foot: Secondary | ICD-10-CM

## 2021-06-11 DIAGNOSIS — M21961 Unspecified acquired deformity of right lower leg: Secondary | ICD-10-CM

## 2021-06-11 DIAGNOSIS — E119 Type 2 diabetes mellitus without complications: Secondary | ICD-10-CM

## 2021-06-11 DIAGNOSIS — M19079 Primary osteoarthritis, unspecified ankle and foot: Secondary | ICD-10-CM | POA: Diagnosis not present

## 2021-06-11 DIAGNOSIS — G8929 Other chronic pain: Secondary | ICD-10-CM

## 2021-06-11 DIAGNOSIS — M7750 Other enthesopathy of unspecified foot: Secondary | ICD-10-CM

## 2021-06-11 DIAGNOSIS — M79672 Pain in left foot: Secondary | ICD-10-CM

## 2021-06-11 DIAGNOSIS — M2142 Flat foot [pes planus] (acquired), left foot: Secondary | ICD-10-CM

## 2021-06-11 NOTE — Progress Notes (Signed)
Subjective: ?[name] is a 66 y.o. female patient who presents to office for evaluation of right greater than left foot and ankle pain patient reports that over the last 6 to 8 months the pain has gotten a little worse states that she is always had pain in her feet since she was 66 years old states that she has a very significant history of 3 types of arthritis disease psoriatic osteoarthritis and rheumatoid arthritis states that she is also diabetic and her blood sugar ranges from 82-1 20 states that she also has a history of a pituitary tumor that is contributing to her problem patient currently sees rheumatology every 6 months and is currently on Celebrex and Enbrel patient admits to a remote history of breaking her toes on the right foot 2 years ago and states still to this day she feels a popping sensation when she walks and tries to bend her right foot and ankle it feels like her bones are going to break.  Patient reports that she is looking for something that can help her walk a little bit better and slow down the arthritic changes in her feet.  Patient denies any new injury or trauma or any other concerns at this time. ? ?Patient Active Problem List  ? Diagnosis Date Noted  ? Nocturnal hypoxemia 09/10/2018  ? Emphysema, unspecified ([place]) 09/10/2018  ? Encounter for loop recorder at end of battery life 05/17/2017  ? Stenosis of right subclavian artery ([place]) 12/22/2015  ? Status post placement of implantable loop recorder 04/23/2014  ? Near syncope 11/22/2012  ? HTN (hypertension) 11/22/2012  ? LBBB (left bundle branch block) 11/22/2012  ? Hyperlipidemia 11/22/2012  ? Tobacco abuse 11/22/2012  ? ? ?Current Outpatient Medications on File Prior to Visit  ?Medication Sig Dispense Refill  ? diclofenac Sodium (VOLTAREN) 1 % GEL See admin instructions.    ? glucose blood (ONETOUCH VERIO) test strip TEST BLOOD SUGAR AS DIRECTED ONCE A DAY; Dx E11.69    ? albuterol (VENTOLIN HFA) 108 (90 Base) MCG/ACT inhaler  Inhale 2 puffs into the lungs every 6 (six) hours as needed for wheezing or shortness of breath.    ? atorvastatin (LIPITOR) 40 MG tablet Take 40 mg by mouth daily.    ? atorvastatin (LIPITOR) 40 MG tablet Take 1 tablet by mouth daily.    ? celecoxib (CELEBREX) 200 MG capsule Take 200 mg by mouth 2 (two) times daily.    ? Cholecalciferol (VITAMIN D) 2000 UNITS tablet Take 2,000 Units by mouth 2 (two) times daily.    ? etanercept (ENBREL) 50 MG/ML injection Inject 50 mg into the skin 2 (two) times a week. Tuesday and Friday.    ? ezetimibe (ZETIA) 10 MG tablet Take 5 mg by mouth daily.    ? fluticasone (FLONASE) 50 MCG/ACT nasal spray Place 1 spray into both nostrils daily.    ? gabapentin (NEURONTIN) 600 MG tablet Take 600 mg by mouth 4 (four) times daily.  5  ? gabapentin (NEURONTIN) 600 MG tablet 1 capsule    ? hydrochlorothiazide (MICROZIDE) 12.5 MG capsule Take 1 capsule (12.5 mg total) by mouth as needed (May take 1-2 times weekly as needed for swelling). 15 capsule 0  ? HYDROmorphone (DILAUDID) 2 MG tablet Take 2 mg by mouth 4 (four) times daily as needed.    ? levothyroxine (SYNTHROID, LEVOTHROID) 100 MCG tablet Take 100 mcg by mouth daily before breakfast.    ? LOTEMAX 0.5 % GEL Apply 1 drop to  eye 2 (two) times daily.    ? methadone (DOLOPHINE) 10 MG tablet Take 10 mg by mouth 4 (four) times daily.     ? olmesartan-hydrochlorothiazide (BENICAR HCT) 40-12.5 MG tablet TAKE 1 TABLET BY MOUTH EVERY DAY 90 tablet 1  ? Omega-3 Fatty Acids (FISH OIL) 1000 MG CAPS Take 1,000 mg by mouth 2 (two) times daily.    ? Oxycodone HCl 10 MG TABS Take 10 mg by mouth 4 (four) times daily as needed.    ? oxyCODONE-acetaminophen (PERCOCET) 10-325 MG tablet Take 1 tablet by mouth 3 (three) times daily as needed for pain.    ? SYNTHROID 88 MCG tablet Take 88 mcg by mouth daily.    ? tiZANidine (ZANAFLEX) 4 MG tablet Take 4 mg by mouth 4 (four) times daily.    ? traZODone (DESYREL) 50 MG tablet Take 50 mg by mouth at bedtime.     ? ?No current facility-administered medications on file prior to visit.  ? ? ?Allergies  ?Allergen Reactions  ? Adalimumab Nausea And Vomiting and Other (See Comments)  ?  Headache,   ? Arava [Leflunomide] Other (See Comments)  ?  Heart races ?  ? Aspirin Other (See Comments)  ?  Upset stomach, burning sensation.  ? Codeine Nausea And Vomiting and Other (See Comments)  ?  Dizzy,heart speeds up  ? Darvocet [Propoxyphene N-Acetaminophen] Other (See Comments)  ?  Heart races  ? Doxycycline Nausea And Vomiting  ? Fioricet [Butalbital-Apap-Caffeine] Other (See Comments)  ?  Heart races.  ? Levofloxacin Nausea And Vomiting  ? Sertraline Hcl Other (See Comments)  ?  Patient states "like a movie going on and can see killing someone else"  ? Sulfa Antibiotics Diarrhea and Other (See Comments)  ?  Stomach upset  ? Pantoprazole Sodium Palpitations  ? ? ?Objective:  ?General: Alert and oriented x3 in no acute distress ? ?Dermatology: No open lesions bilateral lower extremities, no webspace macerations, no ecchymosis bilateral, all nails x 10 are short and thick. ? ?Vascular: Dorsalis Pedis and Posterior Tibial pedal pulses palpable, Capillary Fill Time 5 seconds minimal hair growth trace edema bilateral ankles. ? ?Neurology: Gross sensation intact via light touch bilateral, protective sensation diminished bilateral. ? ?Musculoskeletal: There is mild to moderate tenderness at the dorsal lateral foot and ankle on the right that extends to the level of the dorsal lateral midfoot on the right, there is minimal reproducible symptoms on the left.  There is no subtalar joint motion on the right however there is very minimal subtalar joint motion on the left patient has a severe pes planovalgus deformity with digital contractures and bunion deformity of toes bilateral.  There is significant rear foot valgus and genu valgum deformity noted bilateral.   ? ?Gait: Antalgic gait ? ?Xrays  ?Left and right foot and ankle ?  Impression:  End-stage arthritis noted diffusely throughout the foot with worse arthritis at the subtalar joint ankle joint and TN joints right greater than left there is significant digital deformity no current fracture mild soft tissue swelling no foreign body ? ?Assessment and Plan: ?Problem List Items Addressed This Visit   ?None ?Visit Diagnoses   ? ? Deformity of both feet    -  Primary  ? Relevant Orders  ? DG Ankle Complete Left  ? DG Ankle Complete Right  ? Arthritis of foot      ? Relevant Medications  ? HYDROmorphone (DILAUDID) 2 MG tablet  ? Oxycodone HCl 10 MG TABS  ?  Arthritis of ankle      ? Pes planus of both feet      ? Pain in both feet      ? Chronic pain of both ankles      ? Diabetes mellitus without complication ([place])      ? Relevant Medications  ? atorvastatin (LIPITOR) 40 MG tablet  ? ?  ? ? ? ? ?-Complete examination performed ?-Xrays reviewed ?-Discussed treatment options for severe deformity and chronic pain likely secondary to her extensive history of arthritis; history of rheumatoid degenerative and psoriatic arthritis  ?-At this time declined steroid injection patient reports that she wants to keep this as an option if she has a lot of pain that is out of control she has had injections in the past and her fingers when her joints have swollen up and got warm and red and very tender and patient reports that she wants to save any foot injections because she has had them in the past to help for any flareups right now she feels like she is not dealing with a flareup and wants to discuss other conservative options ?-I advised the patient due to the extent of her condition she will always have pain that we could let her see our pedorthist to see if we can make a custom brace to go inside her shoe to help with her deformity since patient does not want custom shoes at this time ?-Patient to return to office as scheduled to meet with Aaron Edelman for custom braces or sooner if condition worsens. ? ?Landis Martins,  DPM ? ?

## 2021-06-14 ENCOUNTER — Other Ambulatory Visit: Payer: Self-pay

## 2021-06-14 ENCOUNTER — Ambulatory Visit (HOSPITAL_COMMUNITY)
Admission: RE | Admit: 2021-06-14 | Discharge: 2021-06-14 | Disposition: A | Discharge status: Home / Self Care | Payer: Medicare Other | Source: Ambulatory Visit | Attending: Cardiology | Admitting: Cardiology

## 2021-06-14 DIAGNOSIS — I6523 Occlusion and stenosis of bilateral carotid arteries: Secondary | ICD-10-CM | POA: Diagnosis present

## 2021-07-07 ENCOUNTER — Ambulatory Visit: Payer: Medicare Other

## 2021-07-07 DIAGNOSIS — M2142 Flat foot [pes planus] (acquired), left foot: Secondary | ICD-10-CM

## 2021-07-07 DIAGNOSIS — E119 Type 2 diabetes mellitus without complications: Secondary | ICD-10-CM

## 2021-07-07 DIAGNOSIS — M21961 Unspecified acquired deformity of right lower leg: Secondary | ICD-10-CM

## 2021-07-07 NOTE — Progress Notes (Signed)
SITUATION ?Reason for Consult: Evaluation for Custom Diabetic Inserts. ?Patient / Caregiver Report: Patient would like one pair of insoles only ? ?OBJECTIVE DATA: ?Patient History / Diagnosis:  ?  ICD-10-CM   ?1. Diabetes mellitus without complication (HCC)  R48.5   ?  ?2. Pes planus of both feet  M21.41   ? M21.42   ?  ?3. Deformity of both feet  M21.961   ? I62.703   ?  ? ? ?Physician Treating Diabetes:  Deland Pretty, MD ? ?Current or Previous Devices:   None and no history ? ?In-Person Foot Examination: ?Ulcers & Callousing:   None ?Deformities:    Hallux valgus, pes planus, hammertoes ?Sensation:    Compromised  ?Shoe Size:     Men's 9.5-4E ? ?ORTHOTIC RECOMMENDATION ?Recommended Devices: ?- 1x pair custom-to-patient PDAC approved vacuum formed diabetic insoles. ? ?GOALS OF SHOES AND INSOLES ?- Reduce shear and pressure ?- Reduce / Prevent callus formation ?- Reduce / Prevent ulceration ?- Protect the fragile healing compromised diabetic foot. ? ?Patient would benefit from diabetic shoes and inserts as patient has diabetes mellitus and the patient has one or more of the following conditions: ?- History of partial or complete amputation of the foot ?- History of previous foot ulceration. ?- History of pre-ulcerative callus ?- Peripheral neuropathy with evidence of callus formation ?- Foot deformity ?- Poor circulation ? ?ACTIONS PERFORMED ?Potential out of pocket cost was communicated to patient. Patient understood and consented to measurement and casting. Patient was casted for insoles via crush box and measured for shoes via brannock device. Procedure was explained and patient tolerated procedure well. All questions were answered and concerns addressed. Casts were shipped to central fabrication for HOLD until Certificate of Medical Necessity or otherwise necessary authorization from insurance is obtained. ? ?PLAN ?Shoes are to be ordered and casts released from hold once all appropriate paperwork is complete.  Patient is to be contacted and scheduled for fitting once shoes and insoles have been fabricated and received. ? ?

## 2021-07-30 ENCOUNTER — Other Ambulatory Visit (HOSPITAL_COMMUNITY): Payer: Self-pay | Admitting: Cardiovascular Disease

## 2021-07-30 DIAGNOSIS — I6523 Occlusion and stenosis of bilateral carotid arteries: Secondary | ICD-10-CM

## 2021-08-06 ENCOUNTER — Ambulatory Visit (INDEPENDENT_AMBULATORY_CARE_PROVIDER_SITE_OTHER): Payer: Medicare Other | Admitting: Cardiovascular Disease

## 2021-08-06 ENCOUNTER — Encounter: Payer: Self-pay | Admitting: Cardiovascular Disease

## 2021-08-06 VITALS — BP 134/72 | HR 64 | Ht 63.0 in | Wt 159.6 lb

## 2021-08-06 DIAGNOSIS — I6523 Occlusion and stenosis of bilateral carotid arteries: Secondary | ICD-10-CM | POA: Diagnosis not present

## 2021-08-06 DIAGNOSIS — I10 Essential (primary) hypertension: Secondary | ICD-10-CM

## 2021-08-06 DIAGNOSIS — R55 Syncope and collapse: Secondary | ICD-10-CM | POA: Diagnosis not present

## 2021-08-06 DIAGNOSIS — I447 Left bundle-branch block, unspecified: Secondary | ICD-10-CM

## 2021-08-06 DIAGNOSIS — IMO0001 Reserved for inherently not codable concepts without codable children: Secondary | ICD-10-CM

## 2021-08-06 DIAGNOSIS — I771 Stricture of artery: Secondary | ICD-10-CM | POA: Diagnosis not present

## 2021-08-06 DIAGNOSIS — E7800 Pure hypercholesterolemia, unspecified: Secondary | ICD-10-CM

## 2021-08-06 DIAGNOSIS — F17200 Nicotine dependence, unspecified, uncomplicated: Secondary | ICD-10-CM

## 2021-08-06 NOTE — Patient Instructions (Signed)

## 2021-08-06 NOTE — Progress Notes (Signed)
? ?Cardiology Office Note   ? ?Date:  08/08/2021  ? ?ID:  [name], DOB 09-10-1955, MRN [id number] ? ?PCP:  Haley Pretty, MD  ?Cardiologist:   Sanda Klein, MD  ? ?Chief complaint:  ?Palpitations, near syncope ? ? ?History of Present Illness:  ?[name] is a 66 y.o. female with hypertension, chronic left bundle branch block and neurally mediated syncope, predominantly vasodepressive mechanism.  ? ?She has not had any cardiac complaints since her last appointment.  She has lost about 20 pounds, without really trying.  She has a suspicious and enlarging left kidney complex cyst, possibly kidney cancer but does not want to have surgery, followed w MRI.  Her urologist is Dr. Tresa Johnston. ? ?She has had occasional lightheadedness and her lowest detected blood pressure in last 2 months was 93/55, but she has not had any syncopal events and denies problems with palpitations.  She has not had leg edema, orthopnea, PND or exertional dyspnea.  She is only taking half of the olmesartan-hydrochlorothiazide pill on a daily basis.  Rarely she will take the whole tablet, if she notices her blood pressure is unusually high.  This happens at the most 2 or 3 times a month. ? ?She has a history of numerous intolerances to other antihypertensive medications and claims that only certain generic medication worked well for her.  She prefers to continue hydrochlorothiazide since this helps with her edema. ? ?Labs performed last November are not available for review.  In June 2022 her total cholesterol was 125 and HDL was 44. ? ?She had a implantable loop recorder in place since January 2016-2019 (now explanted), which did not detect any significant arrhythmia.  She has not had full-blown syncope since the loop recorder was implanted but has occasional spells of feeling weak and has documented blood pressure in the 70s.   ? ?She has problems with psoriatic and rheumatoid arthritis improved on chronic infusions of Enbrel.  Labs are  checked repeatedly in the rheumatology office. ? ?By ultrasound she has a 50 to 60% obstruction in the right subclavian artery, and her blood pressure is a little higher on the left compared to the right.  Does not have symptoms of claudication or subclavian steal. ? ?Mrs. 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 She also has a long-standing left bundle branch block which has been documented at least since 2007. She had a normal echocardiogram in 2012. Normal nuclear stress test in 2011. Minimal plaque in the carotids by ultrasonography in 2011 and December 2014. She has moderate right subclavian artery stenosis. The ultrasound performed in 2014 showed a stable 50-69 % in the right subclavian artery. The blood pressure gradient is 10-20 mmHg. The peak velocity in the subclavian artery was 325 cm/s (contralateral 243 cm/s).  Loop recorder was implanted in January 2016 after an episode of syncope, but it has never shows significant arrhythmia and she has not had recurrent syncope.  Presumed vasovagal mechanism. ? ?Past Medical History:  ?Diagnosis Date  ? Addiction, opium ([place])   ? Arthritis   ? Cancer of kidney ([place])   ? Chronic back pain   ? COPD (chronic obstructive pulmonary disease) ([place])   ? Fibromyalgia   ? GERD (gastroesophageal reflux disease)   ? Glaucoma   ? Hypercholesteremia   ? Hyperlipemia   ? Hypertension   ? Hypokalemia   ? Hypothyroid   ? Laryngitis   ? LBBB (left bundle branch block)   ?  Osteopenia   ? Osteopenia   ? Psoriasis   ? Psoriatic arthritis ([place])   ? Rheumatoid arthritis ([place])   ? Sjogren's syndrome ([place])   ? Stenosis of right subclavian artery ([place]) 12/22/2015  ? ? ?Past Surgical History:  ?Procedure Laterality Date  ? ABDOMINAL HYSTERECTOMY    ? APPENDECTOMY    ? CARDIAC CATHETERIZATION  06/18/2002  ? normal  ? CESAREAN SECTION    ? LAPAROSCOPIC OVARIAN CYSTECTOMY    ? LOOP RECORDER IMPLANT N/A 04/22/2014  ? Procedure: LOOP RECORDER  IMPLANT;  Surgeon: Sanda Klein, MD;  Location: [facility];  Service: Cardiovascular;  Laterality: N/A;  ? LOOP RECORDER REMOVAL N/A 05/17/2017  ? Procedure: LOOP RECORDER REMOVAL;  Surgeon: Sanda Klein, MD;  Location: [facility];  Service: Cardiovascular;  Laterality: N/A;  ? NM MYOCAR PERF WALL MOTION  07/17/2009  ? fixed anteroseptal defect,no ischemia  ? TONSILLECTOMY    ? US ECHOCARDIOGRAPHY  08/05/2010  ? normal  ? ? ?Current Medications: ?Outpatient Medications Prior to Visit  ?Medication Sig Dispense Refill  ? albuterol (VENTOLIN HFA) 108 (90 Base) MCG/ACT inhaler Inhale 2 puffs into the lungs every 6 (six) hours as needed for wheezing or shortness of breath.    ? atorvastatin (LIPITOR) 40 MG tablet Take 40 mg by mouth daily.    ? celecoxib (CELEBREX) 200 MG capsule Take 200 mg by mouth 2 (two) times daily.    ? Cholecalciferol (VITAMIN D) 2000 UNITS tablet Take 2,000 Units by mouth 2 (two) times daily.    ? diclofenac Sodium (VOLTAREN) 1 % GEL See admin instructions.    ? etanercept (ENBREL) 50 MG/ML injection Inject 50 mg into the skin 2 (two) times a week. Tuesday and Friday.    ? gabapentin (NEURONTIN) 600 MG tablet 600 mg daily as needed.    ? glucose blood (ONETOUCH VERIO) test strip TEST BLOOD SUGAR AS DIRECTED ONCE A DAY; Dx E11.69    ? LOTEMAX 0.5 % GEL Apply 1 drop to eye 2 (two) times daily.    ? methadone (DOLOPHINE) 10 MG tablet Take 10 mg by mouth 4 (four) times daily.     ? olmesartan-hydrochlorothiazide (BENICAR HCT) 40-12.5 MG tablet TAKE 1 TABLET BY MOUTH EVERY DAY 90 tablet 1  ? Oxycodone HCl 10 MG TABS Take 10 mg by mouth 4 (four) times daily as needed.    ? SYNTHROID 88 MCG tablet Take 88 mcg by mouth daily.    ? tiZANidine (ZANAFLEX) 4 MG tablet Take 4 mg by mouth 4 (four) times daily.    ? ezetimibe (ZETIA) 10 MG tablet Take 5 mg by mouth daily. (Patient not taking: Reported on 08/06/2021)    ? fluticasone (FLONASE) 50 MCG/ACT nasal spray Place 1 spray into both nostrils  daily.    ? hydrochlorothiazide (MICROZIDE) 12.5 MG capsule Take 1 capsule (12.5 mg total) by mouth as needed (May take 1-2 times weekly as needed for swelling). 15 capsule 0  ? HYDROmorphone (DILAUDID) 2 MG tablet Take 2 mg by mouth 4 (four) times daily as needed. (Patient not taking: Reported on 08/06/2021)    ? oxyCODONE-acetaminophen (PERCOCET) 10-325 MG tablet Take 1 tablet by mouth 3 (three) times daily as needed for pain. (Patient not taking: Reported on 08/06/2021)    ? traZODone (DESYREL) 50 MG tablet Take 50 mg by mouth at bedtime. (Patient not taking: Reported on 08/06/2021)    ? atorvastatin (LIPITOR) 40 MG tablet Take 1 tablet by mouth daily.    ?  gabapentin (NEURONTIN) 600 MG tablet Take 600 mg by mouth 4 (four) times daily.  5  ? levothyroxine (SYNTHROID, LEVOTHROID) 100 MCG tablet Take 100 mcg by mouth daily before breakfast. (Patient not taking: Reported on 08/06/2021)    ? Omega-3 Fatty Acids (FISH OIL) 1000 MG CAPS Take 1,000 mg by mouth 2 (two) times daily.    ? ?No facility-administered medications prior to visit.  ?  ? ?Allergies:   Adalimumab, Arava [leflunomide], Aspirin, Codeine, Darvocet [propoxyphene n-acetaminophen], Doxycycline, Fioricet [butalbital-apap-caffeine], Levofloxacin, Sertraline hcl, Sulfa antibiotics, Hydromorphone, and Pantoprazole sodium  ? ?Social History  ? ?Socioeconomic History  ? Marital status: Divorced  ?  Spouse name: Not on file  ? Number of children: 2  ? Years of education: Not on file  ? Highest education level: Not on file  ?Occupational History  ? Occupation: Disabled   ?Tobacco Use  ? Smoking status: Every Day  ?  Packs/day: 0.80  ?  Years: 48.00  ?  Pack years: 38.40  ?  Types: Cigarettes  ? Smokeless tobacco: Never  ?Substance and Sexual Activity  ? Alcohol use: No  ?  Alcohol/week: 0.0 standard drinks  ? Drug use: No  ? Sexual activity: Not on file  ?Other Topics Concern  ? Not on file  ?Social History Narrative  ? Drinks 4-6 cups of coffee a day and 2 sodas    ? ?Social Determinants of Health  ? ?Financial Resource Strain: Not on file  ?Food Insecurity: Not on file  ?Transportation Needs: Not on file  ?Physical Activity: Not on file  ?Stress: Not on file  ?Social Co

## 2021-08-08 ENCOUNTER — Encounter: Payer: Self-pay | Admitting: Cardiovascular Disease

## 2021-08-26 ENCOUNTER — Other Ambulatory Visit: Payer: Self-pay | Admitting: *Deleted

## 2021-08-26 DIAGNOSIS — Z122 Encounter for screening for malignant neoplasm of respiratory organs: Secondary | ICD-10-CM

## 2021-08-26 DIAGNOSIS — F17210 Nicotine dependence, cigarettes, uncomplicated: Secondary | ICD-10-CM

## 2021-08-26 DIAGNOSIS — Z87891 Personal history of nicotine dependence: Secondary | ICD-10-CM

## 2021-09-08 ENCOUNTER — Ambulatory Visit
Admission: RE | Admit: 2021-09-08 | Discharge: 2021-09-08 | Disposition: A | Discharge status: Home / Self Care | Payer: Medicare Other | Source: Ambulatory Visit | Attending: Acute Care | Admitting: Acute Care

## 2021-09-08 DIAGNOSIS — Z87891 Personal history of nicotine dependence: Secondary | ICD-10-CM

## 2021-09-08 DIAGNOSIS — F17210 Nicotine dependence, cigarettes, uncomplicated: Secondary | ICD-10-CM

## 2021-09-08 DIAGNOSIS — Z122 Encounter for screening for malignant neoplasm of respiratory organs: Secondary | ICD-10-CM

## 2021-09-09 ENCOUNTER — Other Ambulatory Visit: Payer: Self-pay

## 2021-09-09 DIAGNOSIS — F17210 Nicotine dependence, cigarettes, uncomplicated: Secondary | ICD-10-CM

## 2021-09-09 DIAGNOSIS — Z122 Encounter for screening for malignant neoplasm of respiratory organs: Secondary | ICD-10-CM

## 2021-09-09 DIAGNOSIS — Z87891 Personal history of nicotine dependence: Secondary | ICD-10-CM

## 2021-09-24 ENCOUNTER — Telehealth: Payer: Self-pay | Admitting: Sports Medicine

## 2021-09-24 NOTE — Telephone Encounter (Signed)
Pt called and is scheduled to see endocrinologist 7.24 and would like the paperwork mailed to her to take to the doctor. Her Endocrinologist is Dr Chalmers [email or web address] verified pts address.

## 2021-09-28 NOTE — Telephone Encounter (Signed)
Spoke with [name] and she requested to have paperwork mailed to her to take to her appointment with Dr Chalmers Cater. Will generate new CMN and mail with no response letter.

## 2021-12-08 ENCOUNTER — Encounter: Payer: Self-pay | Admitting: Podiatry

## 2022-04-13 LAB — LAB REPORT - SCANNED
A1c: 5.8
EGFR: 80

## 2022-06-27 ENCOUNTER — Ambulatory Visit (HOSPITAL_COMMUNITY)
Admission: RE | Admit: 2022-06-27 | Discharge: 2022-06-27 | Disposition: A | Discharge status: Home / Self Care | Payer: Medicare Other | Source: Ambulatory Visit | Attending: Internal Medicine | Admitting: Internal Medicine

## 2022-06-27 DIAGNOSIS — I6523 Occlusion and stenosis of bilateral carotid arteries: Secondary | ICD-10-CM

## 2022-08-12 ENCOUNTER — Ambulatory Visit: Payer: Medicare Other | Attending: Cardiovascular Disease | Admitting: Cardiovascular Disease

## 2022-08-12 ENCOUNTER — Encounter: Payer: Self-pay | Admitting: Cardiovascular Disease

## 2022-08-12 ENCOUNTER — Telehealth: Payer: Self-pay | Admitting: Emergency Medicine

## 2022-08-12 VITALS — BP 148/70 | HR 64 | Ht 63.0 in | Wt 151.6 lb

## 2022-08-12 DIAGNOSIS — F17200 Nicotine dependence, unspecified, uncomplicated: Secondary | ICD-10-CM | POA: Diagnosis present

## 2022-08-12 DIAGNOSIS — IMO0001 Reserved for inherently not codable concepts without codable children: Secondary | ICD-10-CM

## 2022-08-12 DIAGNOSIS — I447 Left bundle-branch block, unspecified: Secondary | ICD-10-CM | POA: Diagnosis not present

## 2022-08-12 DIAGNOSIS — I6521 Occlusion and stenosis of right carotid artery: Secondary | ICD-10-CM | POA: Diagnosis not present

## 2022-08-12 DIAGNOSIS — I771 Stricture of artery: Secondary | ICD-10-CM | POA: Diagnosis not present

## 2022-08-12 DIAGNOSIS — Z87898 Personal history of other specified conditions: Secondary | ICD-10-CM | POA: Diagnosis not present

## 2022-08-12 DIAGNOSIS — I10 Essential (primary) hypertension: Secondary | ICD-10-CM | POA: Diagnosis present

## 2022-08-12 DIAGNOSIS — E7800 Pure hypercholesterolemia, unspecified: Secondary | ICD-10-CM

## 2022-08-12 NOTE — Telephone Encounter (Signed)
Faxed request for most recent lab work to her pcp Dr Renne Crigler

## 2022-08-12 NOTE — Patient Instructions (Signed)
Medication Instructions:  No changes *If you need a refill on your cardiac medications before your next appointment, please call your pharmacy*  Follow-Up: At [facility], you and your health needs are our priority.  As part of our continuing mission to provide you with exceptional heart care, we have created designated Provider Care Teams.  These Care Teams include your primary Cardiologist (physician) and Advanced Practice Providers (APPs -  Physician Assistants and Nurse Practitioners) who all work together to provide you with the care you need, when you need it.  We recommend signing up for the patient portal called "MyChart".  Sign up information is provided on this After Visit Summary.  MyChart is used to connect with patients for Virtual Visits (Telemedicine).  Patients are able to view lab/test results, encounter notes, upcoming appointments, etc.  Non-urgent messages can be sent to your provider as well.   To learn more about what you can do with MyChart, go to [email or web address].    Your next appointment:   1 year(s)  Provider:   Thurmon Fair, MD     Other Instructions Keep trying to stop smoking. YOU CAN DO IT!!

## 2022-08-12 NOTE — Progress Notes (Signed)
Cardiology Office Note    Date:  08/12/2022   ID:  Haley, Johnston [birth year], MRN [id number]  PCP:  Merri Brunette, MD  Cardiologist:   Thurmon Fair, MD   Chief complaint:  Palpitations, near syncope   History of Present Illness:  [name] is a 67 y.o. female with hypertension, chronic left bundle branch block and neurally mediated syncope, predominantly vasodepressor mechanism, moderate carotid artery stenosis, ongoing tobacco use.   She is trying to eat a very healthy diet and she exercises regularly.  She has been slowly losing weight and her BMI is down to under 27.  She's lost another 8 pounds since last year and about 30 pounds in the last few years.  She has occasional weak spells and "sees stars", reminiscent of her previous presyncope, but these resolve fairly quickly and she has not had full-blown syncope or falls.  He has not had problems with chest pain or shortness of breath at rest with activity and denies lower extremity edema, orthopnea, PND, claudication, focal neurological complaints.  She usually takes only half of her olmesartan-hydrochlorothiazide tablet and her blood pressure runs in the 120-130s/70s.  Occasionally her blood pressure will have periods of being increased as high as 160s/90s and then she will take the full tablet for a few days.  Unfortunately she continues to smoke roughly 4 packs of cigarettes a week.  She feels she needs to do it for the emotional calming effect.  Her recent carotid ultrasound showed slight progression of stenosis in the right carotid artery which remains in the moderate range.  No change in the subclavian artery abnormality.  Has bilateral small complex kidney cysts that are been followed by Dr. Berneice Heinrich.  She has a history of numerous intolerances to other antihypertensive medications and claims that only certain generic medication worked well for her.  She prefers to continue hydrochlorothiazide since this helps with her  edema.  Labs performed last November are not available for review.  In June 2022 her total cholesterol was 125 and HDL was 44.  She had a implantable loop recorder in place since January 2016-2019 (now explanted), which did not detect any significant arrhythmia.  She has not had full-blown syncope since the loop recorder was implanted but has occasional spells of feeling weak and has documented blood pressure in the 70s.    She has problems with psoriatic and rheumatoid arthritis improved on chronic infusions of Enbrel.  Labs are checked repeatedly in the rheumatology office.  By ultrasound she has a 50 to 60% obstruction in the right subclavian artery, and her blood pressure is a little higher on the left compared to the right.  Does not have symptoms of claudication or subclavian steal.  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 She also has a long-standing left bundle branch block which has been documented at least since 2007. She had a normal echocardiogram in 2012. Normal nuclear stress test in 2011. Minimal plaque in the carotids by ultrasonography in 2011 and December 2014. She has moderate right subclavian artery stenosis. The ultrasound performed in 2014 showed a stable 50-69 % in the right subclavian artery. The blood pressure gradient is 10-20 mmHg. The peak velocity in the subclavian artery was 325 cm/s (contralateral 243 cm/s).  Loop recorder was implanted in January 2016 after an episode of syncope, but it has never shows significant arrhythmia and she has not had recurrent syncope.  Presumed vasovagal mechanism.  Past Medical History:  Diagnosis Date   Addiction, opium (HCC)    Arthritis    Cancer of kidney (HCC)    Chronic back pain    COPD (chronic obstructive pulmonary disease) (HCC)    Fibromyalgia    GERD (gastroesophageal reflux disease)    Glaucoma    Hypercholesteremia    Hyperlipemia    Hypertension     Hypokalemia    Hypothyroid    Laryngitis    LBBB (left bundle branch block)    Osteopenia    Osteopenia    Psoriasis    Psoriatic arthritis (HCC)    Rheumatoid arthritis (HCC)    Sjogren's syndrome (HCC)    Stenosis of right subclavian artery (HCC) 12/22/2015    Past Surgical History:  Procedure Laterality Date   ABDOMINAL HYSTERECTOMY     APPENDECTOMY     CARDIAC CATHETERIZATION  06/18/2002   normal   CESAREAN SECTION     LAPAROSCOPIC OVARIAN CYSTECTOMY     LOOP RECORDER IMPLANT N/A 04/22/2014   Procedure: LOOP RECORDER IMPLANT;  Surgeon: Thurmon Fair, MD;  Location: [facility] CATH [facility];  Service: Cardiovascular;  Laterality: N/A;   LOOP RECORDER REMOVAL N/A 05/17/2017   Procedure: LOOP RECORDER REMOVAL;  Surgeon: Thurmon Fair, MD;  Location: [facility] INVASIVE CV LAB;  Service: Cardiovascular;  Laterality: N/A;   NM MYOCAR PERF WALL MOTION  07/17/2009   fixed anteroseptal defect,no ischemia   TONSILLECTOMY     US ECHOCARDIOGRAPHY  08/05/2010   normal    Current Medications: Outpatient Medications Prior to Visit  Medication Sig Dispense Refill   albuterol (VENTOLIN HFA) 108 (90 Base) MCG/ACT inhaler Inhale 2 puffs into the lungs every 6 (six) hours as needed for wheezing or shortness of breath.     atorvastatin (LIPITOR) 40 MG tablet Take 40 mg by mouth daily.     celecoxib (CELEBREX) 200 MG capsule Take 200 mg by mouth 2 (two) times daily.     Cholecalciferol (VITAMIN D) 2000 UNITS tablet Take 2,000 Units by mouth 2 (two) times daily.     etanercept (ENBREL) 50 MG/ML injection Inject 50 mg into the skin 2 (two) times a week. Tuesday and Friday.     gabapentin (NEURONTIN) 600 MG tablet 600 mg daily as needed.     glucose blood (ONETOUCH VERIO) test strip TEST BLOOD SUGAR AS DIRECTED ONCE A DAY; Dx E11.69     LOTEMAX 0.5 % GEL Apply 1 drop to eye 2 (two) times daily.     methadone (DOLOPHINE) 10 MG tablet Take 10 mg by mouth 4 (four) times daily.      olmesartan-hydrochlorothiazide (BENICAR  HCT) 40-12.5 MG tablet TAKE 1 TABLET BY MOUTH EVERY DAY 90 tablet 1   Oxycodone HCl 10 MG TABS Take 10 mg by mouth 4 (four) times daily as needed.     SYNTHROID 88 MCG tablet Take 88 mcg by mouth daily.     tiZANidine (ZANAFLEX) 4 MG tablet Take 4 mg by mouth 4 (four) times daily.     traZODone (DESYREL) 50 MG tablet Take 50 mg by mouth at bedtime.     vitamin C (ASCORBIC ACID) 250 MG tablet Take 250 mg by mouth daily.     diclofenac Sodium (VOLTAREN) 1 % GEL See admin instructions. (Patient not taking: Reported on 08/12/2022)     ezetimibe (ZETIA) 10 MG tablet Take 5 mg by mouth daily. (Patient not taking: Reported on 08/12/2022)     fluticasone (FLONASE) 50 MCG/ACT nasal spray Place 1  spray into both nostrils daily. (Patient not taking: Reported on 08/12/2022)     hydrochlorothiazide (MICROZIDE) 12.5 MG capsule Take 1 capsule (12.5 mg total) by mouth as needed (May take 1-2 times weekly as needed for swelling). 15 capsule 0   HYDROmorphone (DILAUDID) 2 MG tablet Take 2 mg by mouth 4 (four) times daily as needed. (Patient not taking: Reported on 08/12/2022)     oxyCODONE-acetaminophen (PERCOCET) 10-325 MG tablet Take 1 tablet by mouth 3 (three) times daily as needed for pain. (Patient not taking: Reported on 08/12/2022)     No facility-administered medications prior to visit.     Allergies:   Adalimumab, Arava [leflunomide], Aspirin, Codeine, Darvocet [propoxyphene n-acetaminophen], Doxycycline, Fioricet [butalbital-apap-caffeine], Levofloxacin, Sertraline hcl, Sulfa antibiotics, Hydromorphone, and Pantoprazole sodium   Social History   Socioeconomic History   Marital status: Divorced    Spouse name: Not on file   Number of children: 2   Years of education: Not on file   Highest education level: Not on file  Occupational History   Occupation: Disabled   Tobacco Use   Smoking status: Every Day    Packs/day: 0.80    Years: 48.00    Additional pack years: 0.00    Total pack years: 38.40     Types: Cigarettes   Smokeless tobacco: Never  Substance and Sexual Activity   Alcohol use: No    Alcohol/week: 0.0 standard drinks of alcohol   Drug use: No   Sexual activity: Not on file  Other Topics Concern   Not on file  Social History Narrative   Drinks 4-6 cups of coffee a day and 2 sodas    Social Determinants of Health   Financial Resource Strain: Not on file  Food Insecurity: Not on file  Transportation Needs: Not on file  Physical Activity: Not on file  Stress: Not on file  Social Connections: Not on file     Family History:  The patient's family history includes Asthma in her father; Cancer in her brother and brother; Diabetes in her father and mother; Heart failure in her father and mother; Hypertension in her brother, father, mother, and sister.   ROS:   Please see the history of present illness.    ROS All other systems are reviewed and are negative.   PHYSICAL EXAM:   VS:  BP (!) 148/70 (BP Location: Left Arm, Patient Position: Sitting, Cuff Size: Large)   Pulse 64   Ht 5\' 3"  (1.6 m)   Wt 151 lb 9.6 oz (68.8 kg)   SpO2 97%   BMI 26.85 kg/m     Blood pressure is 10 mmHg lower on the right.   General: Alert, oriented x3, no distress, appears fit.  Minimally overweight. Head: no evidence of trauma, PERRL, EOMI, no exophtalmos or lid lag, no myxedema, no xanthelasma; normal ears, nose and oropharynx Neck: normal jugular venous pulsations and no hepatojugular reflux; brisk carotid pulses without delay and no carotid bruits Chest: clear to auscultation, no signs of consolidation by percussion or palpation, normal fremitus, symmetrical and full respiratory excursions Cardiovascular: normal position and quality of the apical impulse, regular rhythm, normal first and second heart sounds, no murmurs, rubs or gallops Abdomen: no tenderness or distention, no masses by palpation, no abnormal pulsatility or arterial bruits, normal bowel sounds, no  hepatosplenomegaly Extremities: no clubbing, cyanosis or edema; 2+ radial, ulnar and brachial pulses bilaterally; 2+ right femoral, posterior tibial and dorsalis pedis pulses; 2+ left femoral, posterior tibial and dorsalis pedis  pulses; no subclavian or femoral bruits Neurological: grossly nonfocal Psych: Normal mood and affect     Wt Readings from Last 3 Encounters:  08/12/22 151 lb 9.6 oz (68.8 kg)  08/06/21 159 lb 9.6 oz (72.4 kg)  05/27/20 178 lb (80.7 kg)      Studies/Labs Reviewed:   EKG:  EKG is ordered today.  Similar to previous tracings it shows normal sinus rhythm, old left bundle branch block (QRS 148 ms), QTc 476 ms.  Carotid duplex ultrasound 06/27/2022:      Summary:  Right Carotid: Velocities in the right ICA are consistent with a 40-59%   stenosis.      Non-hemodynamically significant plaque <50% noted in the CCA.    The ECA appears >50% stenosed.   Left Carotid: Velocities in the left ICA are consistent with a 40-59% stenosis.   Vertebrals: Bilateral vertebral arteries demonstrate antegrade flow.  Subclavians: Right subclavian artery was stenotic. Bilateral subclavian  artery flow was disturbed.     Recent Labs: No results found for requested labs within last 365 days.  02/19/19/2021 TSH 0.79 702 2021 creatinine 0.4 Lipid Panel    Component Value Date/Time   CHOL  08/14/2008 0210    139        ATP III CLASSIFICATION:  <200     mg/dL   Desirable  454-098  mg/dL   Borderline High  >=119    mg/dL   High          TRIG 147 08/14/2008 0210   HDL 28 (L) 08/14/2008 0210   CHOLHDL 5.0 08/14/2008 0210   VLDL 25 08/14/2008 0210   LDLCALC  08/14/2008 0210    86        Total Cholesterol/HDL:CHD Risk Coronary Heart Disease Risk Table                     Men   Women  1/2 Average Risk   3.4   3.3  Average Risk       5.0   4.4  2 X Average Risk   9.6   7.1  3 X Average Risk  23.4   11.0        Use the calculated Patient Ratio above and the CHD Risk  Table to determine the patient's CHD Risk.        ATP III CLASSIFICATION (LDL):  <100     mg/dL   Optimal  829-562  mg/dL   Near or Above                    Optimal  130-159  mg/dL   Borderline  130-865  mg/dL   High  >784     mg/dL   Very High   69/09/2950  total cholesterol 125, HDL 52, LDL 59, triglycerides 69 hemoglobin A1c 6%  02/19/2020 total cholesterol 124, HDL cholesterol 54, LDL cholesterol 56, triglycerides 44  ASSESSMENT:    No diagnosis found.    PLAN:  In order of problems listed above:  Syncope: It has been several years since she's actually had syncope since she is able to recognize the symptoms of vasodepressive response and overt syncope by leaning over or laying down.  No meaningful arrhythmia was detected during 3 years of loop recorder monitoring. LBBB: Longstanding chronic conduction abnormality.    During 3 years of loop recorder monitoring she never had evidence of advanced AV block.  The possibility of progression to second or third-degree AV block needs  to be kept in mind if she has recurrent syncope, especially if this occurs without prodrome Right subclavian stenosis/right internal carotid artery stenosis: No neurological complaints, no upper extremity claudication.  Focus on risk factor modification. HLP: Need to get the labs ordered by her PCP last November.  Target LDL less than 70. HTN: Pressure generally well-controlled,  taking half of the olmesartan-hydrochlorothiazide 40/12.5 mg tablet.  Has had problems with symptomatic hypotension when taking the whole tablet.  Now only uses that dose intermittently, no more than 2 or 3 days a month. Tobacco abuse: Strongly encourage complete smoking cessation as one of the most important interventions to prevent progression of atherosclerotic disease.    Medication Adjustments/Labs and Tests Ordered: Current medicines are reviewed at length with the patient today.  Concerns regarding medicines are outlined  above.  Medication changes, Labs and Tests ordered today are listed in the Patient Instructions below. There are no Patient Instructions on file for this visit.    Signed, Thurmon Fair, MD  08/12/2022 9:14 AM    [facility] [address] [place], [place], [place]  [address] Phone: [phone]; Fax: [phone]

## 2022-08-27 ENCOUNTER — Other Ambulatory Visit: Payer: Self-pay | Admitting: Acute Care

## 2022-08-27 DIAGNOSIS — Z87891 Personal history of nicotine dependence: Secondary | ICD-10-CM

## 2022-08-27 DIAGNOSIS — Z122 Encounter for screening for malignant neoplasm of respiratory organs: Secondary | ICD-10-CM

## 2022-08-27 DIAGNOSIS — F17210 Nicotine dependence, cigarettes, uncomplicated: Secondary | ICD-10-CM

## 2022-09-06 ENCOUNTER — Telehealth: Payer: Self-pay | Admitting: Emergency Medicine

## 2022-09-06 NOTE — Telephone Encounter (Signed)
Fax sent to [facility] [phone]- Medical Records and [phone] requesting most recent lab work

## 2022-09-13 ENCOUNTER — Ambulatory Visit (HOSPITAL_BASED_OUTPATIENT_CLINIC_OR_DEPARTMENT_OTHER)
Admission: RE | Admit: 2022-09-13 | Discharge: 2022-09-13 | Disposition: A | Discharge status: Home / Self Care | Payer: Medicare Other | Source: Ambulatory Visit | Attending: Internal Medicine | Admitting: Internal Medicine

## 2022-09-13 ENCOUNTER — Other Ambulatory Visit: Payer: Self-pay | Admitting: Emergency Medicine

## 2022-09-13 ENCOUNTER — Encounter: Payer: Self-pay | Admitting: Emergency Medicine

## 2022-09-13 DIAGNOSIS — F17210 Nicotine dependence, cigarettes, uncomplicated: Secondary | ICD-10-CM

## 2022-09-13 DIAGNOSIS — Z87891 Personal history of nicotine dependence: Secondary | ICD-10-CM

## 2022-09-13 DIAGNOSIS — Z122 Encounter for screening for malignant neoplasm of respiratory organs: Secondary | ICD-10-CM | POA: Diagnosis present

## 2022-09-13 DIAGNOSIS — E7800 Pure hypercholesterolemia, unspecified: Secondary | ICD-10-CM

## 2022-09-20 ENCOUNTER — Other Ambulatory Visit: Payer: Self-pay | Admitting: Acute Care

## 2022-09-20 DIAGNOSIS — Z122 Encounter for screening for malignant neoplasm of respiratory organs: Secondary | ICD-10-CM

## 2022-09-20 DIAGNOSIS — Z87891 Personal history of nicotine dependence: Secondary | ICD-10-CM

## 2022-09-20 DIAGNOSIS — F17210 Nicotine dependence, cigarettes, uncomplicated: Secondary | ICD-10-CM

## 2022-09-22 ENCOUNTER — Other Ambulatory Visit: Payer: Self-pay

## 2022-09-22 DIAGNOSIS — E7800 Pure hypercholesterolemia, unspecified: Secondary | ICD-10-CM

## 2022-09-23 LAB — LIPID PANEL
Chol/HDL Ratio: 2.6 ratio (ref 0.0–4.4)
Cholesterol, Total: 139 mg/dL (ref 100–199)
HDL: 54 mg/dL (ref >39)
LDL Chol Calc (NIH): 75 mg/dL (ref 0–99)
Triglycerides: 40 mg/dL (ref 0–149)
VLDL Cholesterol Cal: 10 mg/dL (ref 5–40)

## 2022-10-19 ENCOUNTER — Other Ambulatory Visit: Payer: Self-pay | Admitting: Endocrinology

## 2022-10-19 DIAGNOSIS — E041 Nontoxic single thyroid nodule: Secondary | ICD-10-CM

## 2022-10-27 ENCOUNTER — Ambulatory Visit
Admission: RE | Admit: 2022-10-27 | Discharge: 2022-10-27 | Disposition: A | Discharge status: Home / Self Care | Payer: Medicare Other | Source: Ambulatory Visit | Attending: Endocrinology | Admitting: Endocrinology

## 2022-10-27 DIAGNOSIS — E041 Nontoxic single thyroid nodule: Secondary | ICD-10-CM

## 2022-12-16 ENCOUNTER — Emergency Department (HOSPITAL_BASED_OUTPATIENT_CLINIC_OR_DEPARTMENT_OTHER)
Admission: EM | Admit: 2022-12-16 | Discharge: 2022-12-16 | Disposition: A | Discharge status: Home / Self Care | Payer: Medicare Other | Attending: Emergency Medicine | Admitting: Emergency Medicine

## 2022-12-16 DIAGNOSIS — Z79899 Other long term (current) drug therapy: Secondary | ICD-10-CM | POA: Diagnosis not present

## 2022-12-16 DIAGNOSIS — R21 Rash and other nonspecific skin eruption: Secondary | ICD-10-CM | POA: Diagnosis present

## 2022-12-16 DIAGNOSIS — I10 Essential (primary) hypertension: Secondary | ICD-10-CM | POA: Diagnosis not present

## 2022-12-16 DIAGNOSIS — L237 Allergic contact dermatitis due to plants, except food: Secondary | ICD-10-CM | POA: Insufficient documentation

## 2022-12-16 MED ORDER — DEXAMETHASONE SODIUM PHOSPHATE 10 MG/ML IJ SOLN
10.0000 mg | Freq: Once | INTRAMUSCULAR | Status: AC
  Administered 2022-12-16: 10 mg via INTRAMUSCULAR
  Filled 2022-12-16: qty 1

## 2022-12-16 MED ORDER — TRIAMCINOLONE ACETONIDE 0.1 % EX CREA: 1.0000 | TOPICAL_CREAM | Freq: Two times a day (BID) | CUTANEOUS | 0 refills | Status: DC

## 2022-12-16 NOTE — ED Provider Notes (Signed)
[place] [facility] AT [facility] Provider Note   CSN: [id number] Arrival date & time: 12/16/22  0931     History  Chief Complaint  Patient presents with   Rash    [name] is a 67 y.o. female with pmh signfiicant for htn, hld, tobacco abuse who presents with concern for rash on bilateral arms since Tuesday.  She endorses red raised areas on right arm, and some scattered bumps on left arm and right elbow.  She reports that she did get in contact with poison sumac.  She denies rash on other parts of the body.   Rash      Home Medications Prior to Admission medications   Medication Sig Start Date End Date Taking? Authorizing Provider  triamcinolone cream (KENALOG) 0.1 % Apply 1 Application topically 2 (two) times daily. 12/16/22  Yes Topanga Alvelo H, PA-C  albuterol (VENTOLIN HFA) 108 (90 Base) MCG/ACT inhaler Inhale 2 puffs into the lungs every 6 (six) hours as needed for wheezing or shortness of breath.    [provider]  atorvastatin (LIPITOR) 40 MG tablet Take 40 mg by mouth daily.    [provider]  celecoxib (CELEBREX) 200 MG capsule Take 200 mg by mouth 2 (two) times daily.    [provider]  Cholecalciferol (VITAMIN D) 2000 UNITS tablet Take 2,000 Units by mouth 2 (two) times daily.    [provider]  diclofenac Sodium (VOLTAREN) 1 % GEL See admin instructions. Patient not taking: Reported on 08/12/2022 07/09/18   [provider]  etanercept (ENBREL) 50 MG/ML injection Inject 50 mg into the skin 2 (two) times a week. Tuesday and Friday.    [provider]  ezetimibe (ZETIA) 10 MG tablet Take 5 mg by mouth daily. Patient not taking: Reported on 08/12/2022    [provider]  fluticasone (FLONASE) 50 MCG/ACT nasal spray Place 1 spray into both nostrils daily. Patient not taking: Reported on 08/12/2022 02/05/21   [provider]  gabapentin (NEURONTIN) 600 MG tablet 600 mg  daily as needed.    [provider]  glucose blood (ONETOUCH VERIO) test strip TEST BLOOD SUGAR AS DIRECTED ONCE A DAY; Dx E11.69 11/04/20   [provider]  hydrochlorothiazide (MICROZIDE) 12.5 MG capsule Take 1 capsule (12.5 mg total) by mouth as needed (May take 1-2 times weekly as needed for swelling). 05/27/20 08/25/20  Croitoru, Mihai, MD  HYDROmorphone (DILAUDID) 2 MG tablet Take 2 mg by mouth 4 (four) times daily as needed. Patient not taking: Reported on 08/12/2022 04/17/21   [provider]  LOTEMAX 0.5 % GEL Apply 1 drop to eye 2 (two) times daily. 04/16/19   [provider]  methadone (DOLOPHINE) 10 MG tablet Take 10 mg by mouth 4 (four) times daily.  01/16/16   [provider]  olmesartan-hydrochlorothiazide (BENICAR HCT) 40-12.5 MG tablet TAKE 1 TABLET BY MOUTH EVERY DAY 12/16/20   Croitoru, Mihai, MD  Oxycodone HCl 10 MG TABS Take 10 mg by mouth 4 (four) times daily as needed. 06/08/21   [provider]  oxyCODONE-acetaminophen (PERCOCET) 10-325 MG tablet Take 1 tablet by mouth 3 (three) times daily as needed for pain. Patient not taking: Reported on 08/12/2022    [provider]  SYNTHROID 88 MCG tablet Take 88 mcg by mouth daily. 06/10/21   [provider]  tiZANidine (ZANAFLEX) 4 MG tablet Take 4 mg by mouth 4 (four) times daily.    [provider]  traZODone (DESYREL) 50 MG tablet Take 50 mg by mouth at bedtime.    [provider]  vitamin C (ASCORBIC ACID) 250 MG tablet Take 250 mg by mouth daily.    [provider]      Allergies    Adalimumab, Arava [leflunomide], Aspirin, Codeine, Darvocet [propoxyphene n-acetaminophen], Doxycycline, Fioricet [butalbital-apap-caffeine], Levofloxacin, Sertraline hcl, Sulfa antibiotics, Hydromorphone, and Pantoprazole sodium    Review of Systems   Review of Systems  Skin:  Positive for rash.  All other systems reviewed and are negative.   Physical  Exam Updated Vital Signs BP (!) 165/75   Pulse 70   Temp 98.1 F (36.7 C)   Resp 18   SpO2 100%  Physical Exam Vitals and nursing note reviewed.  Constitutional:      General: She is not in acute distress.    Appearance: Normal appearance.  HENT:     Head: Normocephalic and atraumatic.  Eyes:     General:        Right eye: No discharge.        Left eye: No discharge.  Cardiovascular:     Rate and Rhythm: Normal rate and regular rhythm.  Pulmonary:     Effort: Pulmonary effort is normal. No respiratory distress.  Musculoskeletal:        General: No deformity.  Skin:    General: Skin is warm and dry.     Comments: Some red vesicular bumps on right wrist with some discolaration, no evidence of secondary infection. Rash is in linear distribution. Developing rash on left wrist and in crook of right elbow.   Neurological:     Mental Status: She is alert and oriented to person, place, and time.  Psychiatric:        Mood and Affect: Mood normal.        Behavior: Behavior normal.     ED Results / Procedures / Treatments   Labs (all labs ordered are listed, but only abnormal results are displayed) Labs Reviewed - No data to display  EKG None  Radiology No results found.  Procedures Procedures    Medications Ordered in ED Medications  dexamethasone (DECADRON) injection 10 mg (10 mg Intramuscular Given 12/16/22 1007)    ED Course/ Medical Decision Making/ A&P                                 Medical Decision Making  This patient is a 67 y.o. female who presents to the ED for concern of rash on arm.   Differential diagnoses prior to evaluation: Contact dermatitis, poison ivy dermatitis, poison sumac, versus other, low clinical suspicion for viral id reaction, no clinical suspicion for SJS or TEN, no sloughing rash  Past Medical History / Social History / Additional history: Chart reviewed. Pertinent results include:  htn, hld, tobacco abuse  Physical  Exam: Physical exam performed. The pertinent findings include: Some red vesicular bumps on right wrist with some discolaration, no evidence of secondary infection. Rash is in linear distribution. Developing rash on left wrist and in crook of right elbow.    Medications / Treatment: Discussed with patient that overall I would recommend topical treatment given the small distribution of the rash on chest wrists, patient reports that she had very significant relief with Kenalog injection last time she had this, again discussed that topical steroids are likely to have overall equivalent effect but will administer Decadron shot for additional  relief   Disposition: After consideration of the diagnostic results and the patients response to treatment, I feel that patient is stable for discharge with plan as above .   emergency department workup does not suggest an emergent condition requiring admission or immediate intervention beyond what has been performed at this time. The plan is: as above. The patient is safe for discharge and has been instructed to return immediately for worsening symptoms, change in symptoms or any other concerns.  Final Clinical Impression(s) / ED Diagnoses Final diagnoses:  Poison sumac    Rx / DC Orders ED Discharge Orders          Ordered    triamcinolone cream (KENALOG) 0.1 %  2 times daily        12/16/22 0958              Olene Floss, PA-C 12/16/22 1009    Terald Sleeper, MD 12/16/22 1020

## 2022-12-16 NOTE — Discharge Instructions (Signed)
The rash may continue to evolve and have some minor spreading or changing before it resolves. However, the steroids and steroid cream should begin to help if you apply it to the affected are twice daily. You can follow up with your PCP to check for resolution.

## 2022-12-16 NOTE — ED Triage Notes (Signed)
Pt reports a rash to bilateral arms since Tuesday. Pt also has two raised, red areas to her R arm. She reports concern for poison sumac as this is how she has reacted previously and was working outside recently.

## 2023-03-29 LAB — COLOGUARD

## 2023-03-29 LAB — EXTERNAL GENERIC LAB PROCEDURE

## 2023-04-13 ENCOUNTER — Other Ambulatory Visit: Payer: Self-pay | Admitting: Internal Medicine

## 2023-04-13 DIAGNOSIS — E229 Hyperfunction of pituitary gland, unspecified: Secondary | ICD-10-CM

## 2023-04-21 ENCOUNTER — Ambulatory Visit
Admission: RE | Admit: 2023-04-21 | Discharge: 2023-04-21 | Disposition: A | Discharge status: Home / Self Care | Payer: Medicare Other | Source: Ambulatory Visit | Attending: Internal Medicine | Admitting: Internal Medicine

## 2023-04-21 DIAGNOSIS — E229 Hyperfunction of pituitary gland, unspecified: Secondary | ICD-10-CM

## 2023-04-23 ENCOUNTER — Other Ambulatory Visit: Payer: Medicare Other

## 2023-08-18 ENCOUNTER — Ambulatory Visit: Attending: Cardiovascular Disease | Admitting: Cardiovascular Disease

## 2023-08-18 ENCOUNTER — Telehealth: Payer: Self-pay | Admitting: Cardiovascular Disease

## 2023-08-18 ENCOUNTER — Encounter: Payer: Self-pay | Admitting: Cardiovascular Disease

## 2023-08-18 VITALS — BP 140/80 | HR 69 | Ht 63.0 in | Wt 153.0 lb

## 2023-08-18 DIAGNOSIS — L4050 Arthropathic psoriasis, unspecified: Secondary | ICD-10-CM | POA: Insufficient documentation

## 2023-08-18 DIAGNOSIS — I10 Essential (primary) hypertension: Secondary | ICD-10-CM | POA: Diagnosis not present

## 2023-08-18 DIAGNOSIS — E7800 Pure hypercholesterolemia, unspecified: Secondary | ICD-10-CM | POA: Insufficient documentation

## 2023-08-18 DIAGNOSIS — I447 Left bundle-branch block, unspecified: Secondary | ICD-10-CM | POA: Insufficient documentation

## 2023-08-18 DIAGNOSIS — I739 Peripheral vascular disease, unspecified: Secondary | ICD-10-CM | POA: Diagnosis not present

## 2023-08-18 DIAGNOSIS — Z79899 Other long term (current) drug therapy: Secondary | ICD-10-CM | POA: Diagnosis not present

## 2023-08-18 DIAGNOSIS — F17210 Nicotine dependence, cigarettes, uncomplicated: Secondary | ICD-10-CM | POA: Insufficient documentation

## 2023-08-18 DIAGNOSIS — M069 Rheumatoid arthritis, unspecified: Secondary | ICD-10-CM | POA: Diagnosis not present

## 2023-08-18 DIAGNOSIS — I6521 Occlusion and stenosis of right carotid artery: Secondary | ICD-10-CM | POA: Insufficient documentation

## 2023-08-18 DIAGNOSIS — R55 Syncope and collapse: Secondary | ICD-10-CM | POA: Diagnosis present

## 2023-08-18 DIAGNOSIS — R002 Palpitations: Secondary | ICD-10-CM | POA: Insufficient documentation

## 2023-08-18 DIAGNOSIS — Z87898 Personal history of other specified conditions: Secondary | ICD-10-CM | POA: Diagnosis not present

## 2023-08-18 DIAGNOSIS — O99334 Smoking (tobacco) complicating childbirth: Secondary | ICD-10-CM

## 2023-08-18 NOTE — Progress Notes (Signed)
 Cardiology Office Note    Date:  08/18/2023   ID:  Haley, Johnston 18-Sep-1955, MRN [id number]  PCP:  Imelda Man, MD  Cardiologist:   Luana Rumple, MD   Chief complaint:  Palpitations, near syncope   History of Present Illness:  [name] is a 68 y.o. female with hypertension, chronic left bundle branch block and neurally mediated syncope, predominantly vasodepressor mechanism, moderate carotid artery stenosis, ongoing tobacco use.   She has had an occasional episode of slight wooziness but has not had true presyncope or syncope since her last appointment.  She has not had chest pain or shortness of breath.  She had to work very hard physically when her daughter had surgery.  She helped keep up her household and feels that she wore herself out.  She is try to get back on a more normal schedule.  She stopped taking statins because of multiple aches and pains.  She felt that the new generic version of atorvastatin caused a lot of side effects with her joints and even her vision.  She has decided not to take any lipid-lowering medication whatsoever at this time and just is trying to use natural ways to bring down her cholesterol levels.  Unfortunately she continues to smoke.  She says she needs this for relaxation.  She has done a good job with her eating and has lost 25 pounds in the last 2 years.  She reports that her systolic blood pressure is usually in the 130 range, occasionally as high and has also been as low as 90.  Her diastolic blood pressure is always in the 70s.  She continues to self adjust her dose of olmesartan -hydrochlorothiazide .  She has not had a follow-up vascular ultrasound this year.  Her recent carotid ultrasound showed slight progression of stenosis in the right carotid artery which remains in the moderate range.  No change in the subclavian artery abnormality.  Has bilateral small complex kidney cysts that are been followed by Dr. Secundino Dach.  She has a history  of numerous intolerances to other antihypertensive medications and claims that only certain generic medication worked well for her.  She prefers to continue hydrochlorothiazide  since this helps with her edema.  She had a implantable loop recorder in place since January 2016-2019 (now explanted), which did not detect any significant arrhythmia.  She has not had full-blown syncope since the loop recorder was implanted but has occasional spells of feeling weak and has documented blood pressure in the 70s.    She has problems with psoriatic and rheumatoid arthritis improved on chronic infusions of Enbrel.  Labs are checked repeatedly in the rheumatology office.  By ultrasound she has a 50 to 60% obstruction in the right subclavian artery, and her blood pressure is a little higher on the left compared to the right.  Does not have symptoms of claudication or subclavian steal.  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 She also has a long-standing left bundle branch block which has been documented at least since 2007. She had a normal echocardiogram in 2012. Normal nuclear stress test in 2011. Minimal plaque in the carotids by ultrasonography in 2011 and December 2014. She has moderate right subclavian artery stenosis. The ultrasound performed in 2014 showed a stable 50-69 % in the right subclavian artery. The blood pressure gradient is 10-20 mmHg. The peak velocity in the subclavian artery was 325 cm/s (contralateral 243 cm/s).  Loop recorder was implanted  in January 2016 after an episode of syncope, but it has never shows significant arrhythmia and she has not had recurrent syncope.  Presumed vasovagal mechanism.  Past Medical History:  Diagnosis Date   Addiction, opium  (HCC)    Arthritis    Cancer of kidney (HCC)    Chronic back pain    COPD (chronic obstructive pulmonary disease) (HCC)    Fibromyalgia    GERD (gastroesophageal reflux disease)     Glaucoma    Hypercholesteremia    Hyperlipemia    Hypertension    Hypokalemia    Hypothyroid    Laryngitis    LBBB (left bundle branch block)    Osteopenia    Osteopenia    Psoriasis    Psoriatic arthritis (HCC)    Rheumatoid arthritis (HCC)    Sjogren's syndrome (HCC)    Stenosis of right subclavian artery (HCC) 12/22/2015    Past Surgical History:  Procedure Laterality Date   ABDOMINAL HYSTERECTOMY     APPENDECTOMY     CARDIAC CATHETERIZATION  06/18/2002   normal   CESAREAN SECTION     LAPAROSCOPIC OVARIAN CYSTECTOMY     LOOP RECORDER IMPLANT N/A 04/22/2014   Procedure: LOOP RECORDER IMPLANT;  Surgeon: Luana Rumple, MD;  Location: [facility] CATH [facility];  Service: Cardiovascular;  Laterality: N/A;   LOOP RECORDER REMOVAL N/A 05/17/2017   Procedure: LOOP RECORDER REMOVAL;  Surgeon: Luana Rumple, MD;  Location: [facility] INVASIVE CV [facility];  Service: Cardiovascular;  Laterality: N/A;   NM MYOCAR PERF WALL MOTION  07/17/2009   fixed anteroseptal defect,no ischemia   TONSILLECTOMY     US  ECHOCARDIOGRAPHY  08/05/2010   normal    Current Medications: Outpatient Medications Prior to Visit  Medication Sig Dispense Refill   celecoxib (CELEBREX) 200 MG capsule Take 200 mg by mouth 2 (two) times daily.     Cholecalciferol (VITAMIN D) 2000 UNITS tablet Take 2,000 Units by mouth 2 (two) times daily.     diclofenac Sodium (VOLTAREN) 1 % GEL See admin instructions.     etanercept (ENBREL) 50 MG/ML injection Inject 50 mg into the skin once. Tuesday and Friday.     ezetimibe (ZETIA) 10 MG tablet Take 5 mg by mouth daily.     fluticasone (FLONASE) 50 MCG/ACT nasal spray Place 1 spray into both nostrils daily.     gabapentin  (NEURONTIN ) 600 MG tablet 600 mg daily as needed.     methadone  (DOLOPHINE ) 10 MG tablet Take 10 mg by mouth 4 (four) times daily.      olmesartan -hydrochlorothiazide  (BENICAR  HCT) 40-12.5 MG tablet TAKE 1 TABLET BY MOUTH EVERY DAY 90 tablet 1   olmesartan -hydrochlorothiazide  (BENICAR   HCT) 40-25 MG tablet Take 0.5 tablets by mouth daily.     Oxycodone  HCl 10 MG TABS Take 10 mg by mouth 4 (four) times daily as needed.     SYNTHROID 88 MCG tablet Take 88 mcg by mouth daily.     vitamin C (ASCORBIC ACID) 250 MG tablet Take 250 mg by mouth daily.     albuterol (VENTOLIN HFA) 108 (90 Base) MCG/ACT inhaler Inhale 2 puffs into the lungs every 6 (six) hours as needed for wheezing or shortness of breath. (Patient not taking: Reported on 08/18/2023)     glucose blood (ONETOUCH VERIO) test strip TEST BLOOD SUGAR AS DIRECTED ONCE A DAY; Dx E11.69 (Patient not taking: Reported on 08/18/2023)     hydrochlorothiazide  (MICROZIDE ) 12.5 MG capsule Take 1 capsule (12.5 mg total) by mouth as needed (May take 1-2  times weekly as needed for swelling). 15 capsule 0   atorvastatin (LIPITOR) 40 MG tablet Take 40 mg by mouth daily. (Patient not taking: Reported on 08/18/2023)     HYDROmorphone (DILAUDID) 2 MG tablet Take 2 mg by mouth 4 (four) times daily as needed. (Patient not taking: Reported on 08/18/2023)     LOTEMAX 0.5 % GEL Apply 1 drop to eye 2 (two) times daily. (Patient not taking: Reported on 08/18/2023)     oxyCODONE -acetaminophen  (PERCOCET) 10-325 MG tablet Take 1 tablet by mouth 3 (three) times daily as needed for pain. (Patient not taking: Reported on 08/18/2023)     tiZANidine (ZANAFLEX) 4 MG tablet Take 4 mg by mouth 4 (four) times daily. (Patient not taking: Reported on 08/18/2023)     traZODone (DESYREL) 50 MG tablet Take 50 mg by mouth at bedtime. (Patient not taking: Reported on 08/18/2023)     triamcinolone  cream (KENALOG ) 0.1 % Apply 1 Application topically 2 (two) times daily. (Patient not taking: Reported on 08/18/2023) 30 g 0   No facility-administered medications prior to visit.     Allergies:   Adalimumab, Aspirin, Atorvastatin, Codeine, Darvocet [propoxyphene n-acetaminophen ], Doxycycline, Fioricet [butalbital-apap-caffeine], Leflunomide, Levofloxacin, Nystatin, Rosuvastatin, Sertraline,  Sertraline hcl, Simvastatin, Sulfa antibiotics, Hydromorphone, Levofloxacin, and Pantoprazole  sodium   Family History:  The patient's family history includes Asthma in her father; Cancer in her brother and brother; Diabetes in her father and mother; Heart failure in her father and mother; Hypertension in her brother, father, mother, and sister.     PHYSICAL EXAM:   VS:  BP (!) 140/80 (BP Location: Left Arm, Patient Position: Sitting, Cuff Size: Normal)   Pulse 69   Ht 5\' 3"  (1.6 m)   Wt 69.4 kg   SpO2 97%   BMI 27.10 kg/m     Blood pressure is 10 mmHg lower on the right.   General: Alert, oriented x3, no distress, appears quite fit, minimally overweight. Head: no evidence of trauma, PERRL, EOMI, no exophtalmos or lid lag, no myxedema, no xanthelasma; normal ears, nose and oropharynx Neck: normal jugular venous pulsations and no hepatojugular reflux; brisk carotid pulses without delay and no carotid bruits Chest: clear to auscultation, no signs of consolidation by percussion or palpation, normal fremitus, symmetrical and full respiratory excursions Cardiovascular: normal position and quality of the apical impulse, regular rhythm, normal first and second heart sounds, no murmurs, rubs or gallops Abdomen: no tenderness or distention, no masses by palpation, no abnormal pulsatility or arterial bruits, normal bowel sounds, no hepatosplenomegaly Extremities: no clubbing, cyanosis or edema; 2+ radial, ulnar and brachial pulses bilaterally; 2+ right femoral, posterior tibial and dorsalis pedis pulses; 2+ left femoral, posterior tibial and dorsalis pedis pulses; no subclavian or femoral bruits Neurological: grossly nonfocal Psych: Normal mood and affect      Wt Readings from Last 3 Encounters:  08/18/23 69.4 kg  08/12/22 68.8 kg  08/06/21 72.4 kg      Studies/Labs Reviewed:   EKG:    EKG Interpretation Date/Time:  Friday Aug 18 2023 09:30:33 EDT Ventricular Rate:  69 PR  Interval:  158 QRS Duration:  148 QT Interval:  444 QTC Calculation: 475 R Axis:   -11  Text Interpretation: Normal sinus rhythm Left bundle branch block No significant change since last tracing Confirmed by Marg Macmaster ([id number]) on 08/18/2023 9:38:33 AM         Carotid duplex ultrasound 06/27/2022:  Summary:  Right Carotid: Velocities in the right ICA are consistent with a 40-59%  stenosis.      Non-hemodynamically significant plaque <50% noted in the CCA.    The ECA appears >50% stenosed.   Left Carotid: Velocities in the left ICA are consistent with a 40-59% stenosis.   Vertebrals: Bilateral vertebral arteries demonstrate antegrade flow.  Subclavians: Right subclavian artery was stenotic. Bilateral subclavian  artery flow was disturbed.     Recent Labs: No results found for requested labs within last 365 days.  02/19/19/2021 TSH 0.79 702 2021 creatinine 0.4 Lipid Panel    Component Value Date/Time   CHOL 139 09/22/2022 0931   TRIG 40 09/22/2022 0931   HDL 54 09/22/2022 0931   CHOLHDL 2.6 09/22/2022 0931   CHOLHDL 5.0 08/14/2008 0210   VLDL 25 08/14/2008 0210   LDLCALC 75 09/22/2022 0931     ASSESSMENT:    1. History of syncope   2. LBBB (left bundle branch block)   3. Essential hypertension   4. Pure hypercholesterolemia   5. Smoking (tobacco) complicating childbirth   6. PAD (peripheral artery disease) (HCC) -right subclavian artery stenosis   7. Asymptomatic stenosis of right carotid artery      PLAN:  In order of problems listed above:  Syncope: She did not have any meaningful arrhythmia during 3 years of loop recorder monitoring.  She is now very aware of the prodromal symptoms of vasodepressive syncope and is able to abort the events.  She has not had full-blown syncope in several years. LBBB: This has been present for several years.  Longstanding chronic conduction abnormality.    During 3 years of loop recorder monitoring she never had evidence  of advanced AV block.  The possibility of progression to second or third-degree AV block needs to be kept in mind if she has recurrent syncope, especially if this occurs without prodrome Right subclavian stenosis/right internal carotid artery stenosis: Asymptomatic (no CVA/TIA, no claudication) focus on risk factor modification. HLP: She has stopped taking statin.  She refuses to try any other statins and does not want to take Zetia.  I briefly reviewed options for treatment with Repatha or Praluent or Leqvio.  She remains skeptical of any treatment and would like to see what her lipid parameters are with her lifestyle changes.  Will wait another couple of months before we check the labs. HTN: Her blood pressure seems to be generally well-controlled.  Talked about the fact that we are focused on treating her relaxed, sitting down for 10 minutes blood pressure and trying to keep that 130/80 or less. Tobacco abuse: Again encouraged her to quit smoking permanently    Medication Adjustments/Labs and Tests Ordered: Current medicines are reviewed at length with the patient today.  Concerns regarding medicines are outlined above.  Medication changes, Labs and Tests ordered today are listed in the Patient Instructions below. Patient Instructions  Medication Instructions:  PLEASE CALL WITH CURRENT DOSE OF BP MEDICATION: olmesartan -hydrochlorothiazide  (BENICAR  HCT)  *If you need a refill on your cardiac medications before your next appointment, please call your pharmacy*  Repatha and Praluent- similar medications for hyperlipidemia. An injection every 2 weeks  Leqvio- In Clinic Shot every 6 months  Labs: Lipid panel- in 2 months   Follow-Up: At [facility], you and your health needs are our priority.  As part of our continuing mission to provide you with exceptional heart care, our providers are all part of one team.  This team includes your primary Cardiologist (physician) and Advanced  Practice Providers or APPs (Physician Assistants and Nurse  Practitioners) who all work together to provide you with the care you need, when you need it.  Your next appointment:   1 year(s)  Provider:   Luana Rumple, MD    We recommend signing up for the patient portal called "MyChart".  Sign up information is provided on this After Visit Summary.  MyChart is used to connect with patients for Virtual Visits (Telemedicine).  Patients are able to view lab/test results, encounter notes, upcoming appointments, etc.  Non-urgent messages can be sent to your provider as well.   To learn more about what you can do with MyChart, go to [email or web address].           Signed, Luana Rumple, MD  08/18/2023 10:47 AM    [facility] [address] [place], [place], [place]  [address] Phone: [phone]; Fax: [phone]

## 2023-08-18 NOTE — Telephone Encounter (Signed)
 Medication list updated.

## 2023-08-18 NOTE — Patient Instructions (Signed)
 Medication Instructions:  PLEASE CALL WITH CURRENT DOSE OF BP MEDICATION: olmesartan -hydrochlorothiazide  (BENICAR  HCT)  *If you need a refill on your cardiac medications before your next appointment, please call your pharmacy*  Repatha and Praluent- similar medications for hyperlipidemia. An injection every 2 weeks  Leqvio- In Clinic Shot every 6 months  Labs: Lipid panel- in 2 months   Follow-Up: At [facility], you and your health needs are our priority.  As part of our continuing mission to provide you with exceptional heart care, our providers are all part of one team.  This team includes your primary Cardiologist (physician) and Advanced Practice Providers or APPs (Physician Assistants and Nurse Practitioners) who all work together to provide you with the care you need, when you need it.  Your next appointment:   1 year(s)  Provider:   Luana Rumple, MD    We recommend signing up for the patient portal called "MyChart".  Sign up information is provided on this After Visit Summary.  MyChart is used to connect with patients for Virtual Visits (Telemedicine).  Patients are able to view lab/test results, encounter notes, upcoming appointments, etc.  Non-urgent messages can be sent to your provider as well.   To learn more about what you can do with MyChart, go to [email or web address].

## 2023-08-18 NOTE — Telephone Encounter (Signed)
 Pt c/o medication issue:  1. Name of Medication: olmesartan -hydrochlorothiazide  (BENICAR  HCT) 40-25 MG tablet   2. How are you currently taking this medication (dosage and times per day)? As prescribed   3. Are you having a reaction (difficulty breathing--STAT)? No   4. What is your medication issue? Reporting which MG's she is taking. She states she is not taking the 40-12.5 MG.

## 2023-08-18 NOTE — Telephone Encounter (Signed)
 Spoke with patient and she states she is calling to let provider know she is taking Benicar  HCT 40-25

## 2023-09-28 ENCOUNTER — Other Ambulatory Visit: Payer: Self-pay | Admitting: Acute Care

## 2023-09-28 DIAGNOSIS — Z122 Encounter for screening for malignant neoplasm of respiratory organs: Secondary | ICD-10-CM

## 2023-09-28 DIAGNOSIS — F17210 Nicotine dependence, cigarettes, uncomplicated: Secondary | ICD-10-CM

## 2023-09-28 DIAGNOSIS — Z87891 Personal history of nicotine dependence: Secondary | ICD-10-CM

## 2023-10-09 ENCOUNTER — Ambulatory Visit (HOSPITAL_BASED_OUTPATIENT_CLINIC_OR_DEPARTMENT_OTHER)
Admission: RE | Admit: 2023-10-09 | Discharge: 2023-10-09 | Disposition: A | Discharge status: Home / Self Care | Source: Ambulatory Visit | Attending: Acute Care | Admitting: Acute Care

## 2023-10-09 DIAGNOSIS — Z87891 Personal history of nicotine dependence: Secondary | ICD-10-CM | POA: Diagnosis present

## 2023-10-09 DIAGNOSIS — Z122 Encounter for screening for malignant neoplasm of respiratory organs: Secondary | ICD-10-CM | POA: Insufficient documentation

## 2023-10-09 DIAGNOSIS — F17210 Nicotine dependence, cigarettes, uncomplicated: Secondary | ICD-10-CM | POA: Insufficient documentation

## 2023-10-10 ENCOUNTER — Other Ambulatory Visit: Payer: Self-pay | Admitting: Urology

## 2023-10-10 DIAGNOSIS — C642 Malignant neoplasm of left kidney, except renal pelvis: Secondary | ICD-10-CM

## 2023-10-10 DIAGNOSIS — N281 Cyst of kidney, acquired: Secondary | ICD-10-CM

## 2023-10-17 ENCOUNTER — Other Ambulatory Visit: Payer: Self-pay

## 2023-10-17 DIAGNOSIS — Z122 Encounter for screening for malignant neoplasm of respiratory organs: Secondary | ICD-10-CM

## 2023-10-17 DIAGNOSIS — Z87891 Personal history of nicotine dependence: Secondary | ICD-10-CM

## 2023-10-17 DIAGNOSIS — F17210 Nicotine dependence, cigarettes, uncomplicated: Secondary | ICD-10-CM

## 2023-10-27 ENCOUNTER — Encounter: Payer: Self-pay | Admitting: Emergency Medicine

## 2023-11-01 ENCOUNTER — Encounter: Payer: Self-pay | Admitting: Emergency Medicine

## 2023-11-24 LAB — COLOGUARD

## 2023-12-18 ENCOUNTER — Ambulatory Visit
Admission: RE | Admit: 2023-12-18 | Discharge: 2023-12-18 | Disposition: A | Discharge status: Home / Self Care | Source: Ambulatory Visit | Attending: Urology | Admitting: Urology

## 2023-12-18 DIAGNOSIS — C642 Malignant neoplasm of left kidney, except renal pelvis: Secondary | ICD-10-CM

## 2023-12-18 DIAGNOSIS — N281 Cyst of kidney, acquired: Secondary | ICD-10-CM

## 2023-12-18 MED ORDER — GADOPICLENOL 0.5 MMOL/ML IV SOLN
7.5000 mL | Freq: Once | INTRAVENOUS | Status: AC | PRN
  Administered 2023-12-18: 7.5 mL via INTRAVENOUS
